# Patient Record
Sex: Male | Born: 1960 | Race: Black or African American | Hispanic: No | Marital: Married | Smoking: Never smoker
Health system: Southern US, Community
[De-identification: ages and names within clinical notes are randomized; demographics above are authoritative.]

## PROBLEM LIST (undated history)

## (undated) DIAGNOSIS — E119 Type 2 diabetes mellitus without complications: Secondary | ICD-10-CM

## (undated) DIAGNOSIS — I10 Essential (primary) hypertension: Secondary | ICD-10-CM

## (undated) DIAGNOSIS — G4730 Sleep apnea, unspecified: Secondary | ICD-10-CM

## (undated) DIAGNOSIS — M109 Gout, unspecified: Secondary | ICD-10-CM

## (undated) HISTORY — DX: Sleep apnea, unspecified: G47.30

## (undated) HISTORY — DX: Gout, unspecified: M10.9

## (undated) HISTORY — PX: APPENDECTOMY: SHX54

## (undated) HISTORY — DX: Type 2 diabetes mellitus without complications: E11.9

---

## 2005-05-21 ENCOUNTER — Emergency Department: Payer: Self-pay | Admitting: Emergency Medicine

## 2008-09-29 ENCOUNTER — Emergency Department: Payer: Self-pay | Admitting: Emergency Medicine

## 2009-10-21 ENCOUNTER — Emergency Department: Payer: Self-pay | Admitting: Unknown Physician Specialty

## 2009-11-17 ENCOUNTER — Emergency Department: Payer: Self-pay | Admitting: Emergency Medicine

## 2011-06-30 ENCOUNTER — Emergency Department: Payer: Self-pay | Admitting: Emergency Medicine

## 2011-10-07 ENCOUNTER — Emergency Department: Payer: Self-pay | Admitting: Emergency Medicine

## 2011-10-07 LAB — CBC
HCT: 50.6 % (ref 40.0–52.0)
HGB: 18.2 g/dL — ABNORMAL HIGH (ref 13.0–18.0)
MCH: 30.7 pg (ref 26.0–34.0)
MCV: 85 fL (ref 80–100)
RBC: 5.94 10*6/uL — ABNORMAL HIGH (ref 4.40–5.90)

## 2011-10-07 LAB — BASIC METABOLIC PANEL
Anion Gap: 10 (ref 7–16)
BUN: 14 mg/dL (ref 7–18)
Chloride: 106 mmol/L (ref 98–107)
Creatinine: 1.41 mg/dL — ABNORMAL HIGH (ref 0.60–1.30)
EGFR (African American): >60
Glucose: 140 mg/dL — ABNORMAL HIGH (ref 65–99)
Osmolality: 280 (ref 275–301)
Sodium: 139 mmol/L (ref 136–145)

## 2011-10-07 LAB — TROPONIN I: Troponin-I: <0.02 ng/mL

## 2011-10-07 LAB — CK TOTAL AND CKMB (NOT AT ARMC)
CK, Total: 567 U/L — ABNORMAL HIGH (ref 35–232)
CK-MB: 2.1 ng/mL (ref 0.5–3.6)

## 2011-10-08 LAB — TROPONIN I: Troponin-I: <0.02 ng/mL

## 2012-09-05 ENCOUNTER — Emergency Department: Payer: Self-pay | Admitting: Emergency Medicine

## 2012-09-05 LAB — URINALYSIS, COMPLETE
Bilirubin,UR: NEGATIVE
Nitrite: NEGATIVE
Protein: NEGATIVE
RBC,UR: NONE SEEN /HPF (ref 0–5)
Specific Gravity: 1.01 (ref 1.003–1.030)
Squamous Epithelial: NONE SEEN
WBC UR: 1 /HPF (ref 0–5)

## 2012-09-05 LAB — COMPREHENSIVE METABOLIC PANEL
Albumin: 4.1 g/dL (ref 3.4–5.0)
Alkaline Phosphatase: 86 U/L (ref 50–136)
Anion Gap: 4 — ABNORMAL LOW (ref 7–16)
Calcium, Total: 9.1 mg/dL (ref 8.5–10.1)
EGFR (Non-African Amer.): 47 — ABNORMAL LOW
Glucose: 89 mg/dL (ref 65–99)
Osmolality: 277 (ref 275–301)
SGPT (ALT): 45 U/L (ref 12–78)
Sodium: 137 mmol/L (ref 136–145)
Total Protein: 7.8 g/dL (ref 6.4–8.2)

## 2012-09-05 LAB — CBC
HGB: 16.7 g/dL (ref 13.0–18.0)
MCH: 29 pg (ref 26.0–34.0)
MCHC: 34.7 g/dL (ref 32.0–36.0)
MCV: 84 fL (ref 80–100)
RBC: 5.74 10*6/uL (ref 4.40–5.90)
RDW: 14.5 % (ref 11.5–14.5)

## 2015-06-16 ENCOUNTER — Encounter: Payer: Self-pay | Admitting: Emergency Medicine

## 2015-06-16 ENCOUNTER — Emergency Department
Admission: EM | Admit: 2015-06-16 | Discharge: 2015-06-16 | Disposition: A | Discharge status: Home / Self Care | Payer: BLUE CROSS/BLUE SHIELD | Attending: Emergency Medicine | Admitting: Emergency Medicine

## 2015-06-16 ENCOUNTER — Emergency Department: Payer: BLUE CROSS/BLUE SHIELD

## 2015-06-16 DIAGNOSIS — I159 Secondary hypertension, unspecified: Secondary | ICD-10-CM

## 2015-06-16 DIAGNOSIS — H9202 Otalgia, left ear: Secondary | ICD-10-CM | POA: Insufficient documentation

## 2015-06-16 DIAGNOSIS — R609 Edema, unspecified: Secondary | ICD-10-CM | POA: Diagnosis present

## 2015-06-16 HISTORY — DX: Essential (primary) hypertension: I10

## 2015-06-16 LAB — CBC WITH DIFFERENTIAL/PLATELET
BASOS PCT: 0 %
Basophils Absolute: 0 10*3/uL (ref 0–0.1)
EOS ABS: 0 10*3/uL (ref 0–0.7)
Eosinophils Relative: 1 %
HCT: 47.8 % (ref 40.0–52.0)
HEMOGLOBIN: 16.3 g/dL (ref 13.0–18.0)
LYMPHS ABS: 2.1 10*3/uL (ref 1.0–3.6)
LYMPHS PCT: 23 %
MCH: 28.4 pg (ref 26.0–34.0)
MCHC: 34.1 g/dL (ref 32.0–36.0)
MCV: 83.1 fL (ref 80.0–100.0)
MONO ABS: 0.5 10*3/uL (ref 0.2–1.0)
Monocytes Relative: 6 %
NEUTROS ABS: 6.2 10*3/uL (ref 1.4–6.5)
NEUTROS PCT: 70 %
Platelets: 215 10*3/uL (ref 150–440)
RBC: 5.75 MIL/uL (ref 4.40–5.90)
RDW: 14.5 % (ref 11.5–14.5)
WBC: 8.9 10*3/uL (ref 3.8–10.6)

## 2015-06-16 LAB — BASIC METABOLIC PANEL
Anion gap: 10 (ref 5–15)
BUN: 19 mg/dL (ref 6–20)
CALCIUM: 8.9 mg/dL (ref 8.9–10.3)
CO2: 23 mmol/L (ref 22–32)
CREATININE: 1.29 mg/dL — AB (ref 0.61–1.24)
Chloride: 104 mmol/L (ref 101–111)
GFR calc Af Amer: >60 mL/min (ref >60)
GLUCOSE: 135 mg/dL — AB (ref 65–99)
Potassium: 3.6 mmol/L (ref 3.5–5.1)
Sodium: 137 mmol/L (ref 135–145)

## 2015-06-16 MED ORDER — HYDROCHLOROTHIAZIDE 25 MG PO TABS: 25.0000 mg | ORAL_TABLET | Freq: Every day | ORAL | Status: DC

## 2015-06-16 MED ORDER — CLONIDINE HCL 0.2 MG PO TABS
0.2000 mg | ORAL_TABLET | Freq: Two times a day (BID) | ORAL | Status: DC | PRN
Start: 2015-06-16 — End: 2016-01-16

## 2015-06-16 MED ORDER — CLONIDINE HCL 0.1 MG PO TABS
0.2000 mg | ORAL_TABLET | Freq: Once | ORAL | Status: AC
  Administered 2015-06-16: 0.2 mg via ORAL
  Filled 2015-06-16: qty 2

## 2015-06-16 MED ORDER — HYDROCHLOROTHIAZIDE 25 MG PO TABS
25.0000 mg | ORAL_TABLET | Freq: Every day | ORAL | Status: DC
  Administered 2015-06-16: 25 mg via ORAL
  Filled 2015-06-16: qty 1

## 2015-06-16 MED ORDER — CLONIDINE HCL 0.1 MG PO TABS: 0.2000 mg | ORAL_TABLET | Freq: Two times a day (BID) | ORAL | Status: DC | PRN

## 2015-06-16 NOTE — ED Notes (Addendum)
States that job requires a lot of standing and patient has noticed ankle swelling lately that resolves overnight.  Recently (last 4 weeks) has had pain to bottom of left foot near base of heel.  Pain worse when initially walking and improves during the day, but symptoms have worsened.  Patient initially went to [facility] to be seen, but was referred to ED due to elevated BP

## 2015-06-16 NOTE — Discharge Instructions (Signed)

## 2015-06-16 NOTE — ED Notes (Signed)
3 people have attempted blood draw. Unsuccessful. Lab at bedside to attempt.

## 2015-06-17 NOTE — ED Provider Notes (Signed)
Time Seen: Approximately 1700  I have reviewed the triage notes  Chief Complaint: Foot Pain   History of Present Illness: Francisco Rice is a 55 y.o. male who presents after being referred here from [facility]. Patient presented to with complaints of some bilateral peripheral edema and left ear pain. He was found to be hypertensive there. He states he has a history of hypertension but is currently not on any medications. Patient states his swelling is mostly noticed at the end of the day after he is been on his feet through most of the work day. He states he edema will resolve when he props his feet up. He denies any fever, calf tenderness, significant pulmonary or DVT risk factors. He denies any trauma but states he's had some noticeable pain with ambulation at the base of his left heel.   Past Medical History  Diagnosis Date  . Hypertension     There are no active problems to display for this patient.   Past Surgical History  Procedure Laterality Date  . Appendectomy      Past Surgical History  Procedure Laterality Date  . Appendectomy      Current Outpatient Rx  Name  Route  Sig  Dispense  Refill  . cloNIDine (CATAPRES) 0.2 MG tablet   Oral   Take 1 tablet (0.2 mg total) by mouth 2 (two) times daily as needed (hypertension).   60 tablet   0   . hydrochlorothiazide (HYDRODIURIL) 25 MG tablet   Oral   Take 1 tablet (25 mg total) by mouth daily.   30 tablet   0     Allergies:  Review of patient's allergies indicates no known allergies.  Family History: No family history on file.  Social History: Social History  Substance Use Topics  . Smoking status: Never Smoker   . Smokeless tobacco: None  . Alcohol Use: No     Review of Systems:   10 point review of systems was performed and was otherwise negative:  Constitutional: No fever Eyes: No visual disturbances ENT: No sore throat, ear pain Cardiac: No chest pain Respiratory: No shortness of  breath, wheezing, or stridor Abdomen: No abdominal pain, no vomiting, No diarrhea Endocrine: No weight loss, No night sweats Extremities: Mild peripheral edema peripheral edema, especially after standing Skin: No rashes, easy bruising Neurologic: No focal weakness, trouble with speech or swollowing Urologic: No dysuria, Hematuria, or urinary frequency   Physical Exam:  ED Triage Vitals  Enc Vitals Group     BP 06/16/15 1525 173/108 mmHg     Pulse Rate 06/16/15 1525 104     Resp 06/16/15 1525 16     Temp 06/16/15 1525 98.9 F (37.2 C)     Temp Source 06/16/15 1525 Oral     SpO2 06/16/15 1525 96 %     Weight 06/16/15 1525 320 lb (145.151 kg)     Height 06/16/15 1525 6\' 1"  (1.854 m)     Head Cir --      Peak Flow --      Pain Score 06/16/15 1526 3     Pain Loc --      Pain Edu? --      Excl. in GC? --     General: Awake , Alert , and Oriented times 3; GCS 15 Head: Normal cephalic , atraumatic Eyes: Pupils equal , round, reactive to light Nose/Throat: No nasal drainage, patent upper airway without erythema or exudate.  Neck: Supple, Full range  of motion, No anterior adenopathy or palpable thyroid masses Lungs: Clear to ascultation without wheezes , rhonchi, or rales Heart: Regular rate, regular rhythm without murmurs , gallops , or rubs Abdomen: Soft, non tender without rebound, guarding , or rigidity; bowel sounds positive and symmetric in all 4 quadrants. No organomegaly .        Extremities:Mild circumferential edema with tenderness at the base of the left heel with an intact Achilles tendon. No calf tenderness. Negative Homans sign edema is symmetric in both lower extremities Neurologic: normal ambulation, Motor symmetric without deficits, sensory intact Skin: warm, dry, no rashes   Labs:   All laboratory work was reviewed including any pertinent negatives or positives listed below:  Labs Reviewed  BASIC METABOLIC PANEL - Abnormal; Notable for the following:     Glucose, Bld 135 (*)    Creatinine, Ser 1.29 (*)    All other components within normal limits  CBC WITH DIFFERENTIAL/PLATELET  Patient has some mild renal insufficiency which actually seems improved from prior studies  Radiology:     DG Foot 2 Views Left (Final result) Result time: 06/16/15 17:41:44   Final result by Rad Results In Interface (06/16/15 17:41:44)   Narrative:   CLINICAL DATA: Plantar foot pain near the heel for 2 months.  EXAM: LEFT FOOT - 2 VIEW  COMPARISON: None.  FINDINGS: Negative for fracture or dislocation. Prominent spurs involving the calcaneus along the plantar aspect and at the insertion of the Achilles tendon. Alignment of the foot is within normal limits. Soft tissues are unremarkable.  IMPRESSION: Large calcaneal spurs.  No acute bone abnormality.   Electronically Signed By: Richarda Overlie M.D. On: 06/16/2015 17:41       I personally reviewed the radiologic studies    ED Course:  Patient arrives with history of hypertension though currently not on any medications. Patient has some mild peripheral edema but otherwise no clinical presentation for hypertensive emergency. Patient was started on clonidine and serial blood pressures showed some small improvement he'll be discharged on hydrochlorothiazide as that I felt this would help him with the peripheral edema. She was advised his workup is not complete and he'll need to follow-up with the clinic. The patient appears to be of understanding and the patient was discharged.    Assessment: Hypertensive urgency Bone spur left calcaneus  Final Clinical Impression:  Final diagnoses:  Secondary hypertension, unspecified     Plan:  Outpatient management Patient was advised to return immediately if condition worsens. Patient was advised to follow up with their primary care physician or other specialized physicians involved in their outpatient care. The patient and/or family  member/power of attorney had laboratory results reviewed at the bedside. All questions and concerns were addressed and appropriate discharge instructions were distributed by the nursing staff.             Jennye Moccasin, MD 06/17/15 [phone]

## 2015-06-23 ENCOUNTER — Emergency Department
Admission: EM | Admit: 2015-06-23 | Discharge: 2015-06-23 | Disposition: A | Discharge status: Home / Self Care | Payer: BLUE CROSS/BLUE SHIELD | Attending: Emergency Medicine | Admitting: Emergency Medicine

## 2015-06-23 ENCOUNTER — Emergency Department: Payer: BLUE CROSS/BLUE SHIELD

## 2015-06-23 DIAGNOSIS — M7730 Calcaneal spur, unspecified foot: Secondary | ICD-10-CM | POA: Diagnosis not present

## 2015-06-23 DIAGNOSIS — M25571 Pain in right ankle and joints of right foot: Secondary | ICD-10-CM | POA: Diagnosis present

## 2015-06-23 DIAGNOSIS — M722 Plantar fascial fibromatosis: Secondary | ICD-10-CM | POA: Diagnosis not present

## 2015-06-23 DIAGNOSIS — I10 Essential (primary) hypertension: Secondary | ICD-10-CM | POA: Insufficient documentation

## 2015-06-23 NOTE — Discharge Instructions (Signed)
Advised to purchase over-the-counter heel supports pending evaluation by podiatry.

## 2015-06-23 NOTE — ED Notes (Addendum)
See triage note  Pain and swelling noted to right ankle denies any injury

## 2015-06-23 NOTE — ED Provider Notes (Signed)
[facility] Emergency Department Provider Note  ____________________________________________  Time seen: Approximately 3:25 PM  I have reviewed the triage vital signs and the nursing notes.   HISTORY  Chief Complaint Ankle Pain    HPI Francisco Rice is a 55 y.o. male patient complaining of right ankle and heel pain since a.m. awakening. Patient said he worked last night and he stated is consistent prolonged standing. Patient had no discomfort at work but awakened with edema and pain to the right ankle and heel. No palliative measures for this complaint. Patient was seen a week ago for same complaint along with elevated blood pressure. When patient was seen last week in ED his blood pressure medicines was restarted. Patient has not follow-up with primary provider at his family clinic. Patient went to his family clinic today and because of his elevated blood pressure was resent to ER even if he started to medications. Patient wished to have his pain and swelling in his right ankle evaluated. Patient rates his pain as a 2/10. No palliative measures for this complaint. Past Medical History  Diagnosis Date  . Hypertension     There are no active problems to display for this patient.   Past Surgical History  Procedure Laterality Date  . Appendectomy      Current Outpatient Rx  Name  Route  Sig  Dispense  Refill  . cloNIDine (CATAPRES) 0.2 MG tablet   Oral   Take 1 tablet (0.2 mg total) by mouth 2 (two) times daily as needed (hypertension).   60 tablet   0   . hydrochlorothiazide (HYDRODIURIL) 25 MG tablet   Oral   Take 1 tablet (25 mg total) by mouth daily.   30 tablet   0     Allergies Review of patient's allergies indicates no known allergies.  No family history on file.  Social History Social History  Substance Use Topics  . Smoking status: Never Smoker   . Smokeless tobacco: None  . Alcohol Use: No    Review of  Systems Constitutional: No fever/chills Eyes: No visual changes. ENT: No sore throat. Cardiovascular: Denies chest pain. Respiratory: Denies shortness of breath. Gastrointestinal: No abdominal pain.  No nausea, no vomiting.  No diarrhea.  No constipation. Genitourinary: Negative for dysuria. Musculoskeletal: Right ankle pain Skin: Negative for rash. Neurological: Negative for headaches, focal weakness or numbness. Endocrine:Hypertension  ____________________________________________   PHYSICAL EXAM:  VITAL SIGNS: ED Triage Vitals  Enc Vitals Group     BP 06/23/15 1501 167/92 mmHg     Pulse Rate 06/23/15 1501 91     Resp 06/23/15 1501 18     Temp 06/23/15 1501 98.5 F (36.9 C)     Temp Source 06/23/15 1501 Oral     SpO2 06/23/15 1501 97 %     Weight 06/23/15 1501 320 lb (145.151 kg)     Height 06/23/15 1501  (1.854 m)     Head Cir --      Peak Flow --      Pain Score 06/23/15 1502 2     Pain Loc --      Pain Edu? --      Excl. in GC? --     Constitutional: Alert and oriented. Well appearing and in no acute distress.Obesity Eyes: Conjunctivae are normal. PERRL. EOMI. Head: Atraumatic. Nose: No congestion/rhinnorhea. Mouth/Throat: Mucous membranes are moist.  Oropharynx non-erythematous. Neck: No stridor.  No cervical spine tenderness to palpation. Hematological/Lymphatic/Immunilogical: No cervical lymphadenopathy. Cardiovascular:  Normal rate, regular rhythm. Grossly normal heart sounds.  Good peripheral circulation. Hypertension Respiratory: Normal respiratory effort.  No retractions. Lungs CTAB. Gastrointestinal: Soft and nontender. No distention. No abdominal bruits. No CVA tenderness. Musculoskeletal: No obvious deformity or edema to the bilateral lower extremities at this time. Patient has some moderate guarding at the insertion point of the Achilles tendon and the plan aspect of his heel.  Neurologic:  Normal speech and language. No gross focal neurologic  deficits are appreciated. No gait instability. Skin:  Skin is warm, dry and intact. No rash noted. Psychiatric: Mood and affect are normal. Speech and behavior are normal.  ____________________________________________   LABS (all labs ordered are listed, but only abnormal results are displayed)  Labs Reviewed - No data to display ____________________________________________  EKG   ____________________________________________  RADIOLOGY  No acute findings. Soft tissue edema is apparent. Patient has a Achilles and calcaneus spur. ____________________________________________   PROCEDURES  Procedure(s) performed: None  Critical Care performed: No  ____________________________________________   INITIAL IMPRESSION / ASSESSMENT AND PLAN / ED COURSE  Pertinent labs & imaging results that were available during my care of the patient were reviewed by me and considered in my medical decision making (see chart for details).  Plantar fasciitis secondary to heel spurs. Patient given discharge care instructions. Patient advised to purchase over-the-counter heel spur sports pending further evaluation by podiatrist. Advised to follow-up family doctor for his hypertension. ____________________________________________   FINAL CLINICAL IMPRESSION(S) / ED DIAGNOSES  Final diagnoses:  Plantar fasciitis, bilateral  Heel spur, unspecified laterality      NEW MEDICATIONS STARTED DURING THIS VISIT:  New Prescriptions   No medications on file     Note:  This document was prepared using Dragon voice recognition software and may include unintentional dictation errors.    Joni Reiningonald K Smith, PA-C 06/23/15 1602  Myrna Blazeravid Matthew Schaevitz, MD 06/23/15 [phone]

## 2015-06-23 NOTE — ED Notes (Signed)
Pt sent [facility] , pt states he came home from work last night and was fine, states when he got up this morning and has pain and swelling in the right ankle..Marland Kitchen

## 2015-07-21 ENCOUNTER — Ambulatory Visit: Payer: Self-pay | Admitting: Podiatry

## 2016-01-06 ENCOUNTER — Emergency Department: Payer: Commercial Managed Care - PPO

## 2016-01-06 ENCOUNTER — Emergency Department
Admission: EM | Admit: 2016-01-06 | Discharge: 2016-01-06 | Disposition: A | Discharge status: Home / Self Care | Payer: Commercial Managed Care - PPO | Attending: Student in an Organized Health Care Education/Training Program | Admitting: Student in an Organized Health Care Education/Training Program

## 2016-01-06 DIAGNOSIS — I10 Essential (primary) hypertension: Secondary | ICD-10-CM | POA: Diagnosis not present

## 2016-01-06 DIAGNOSIS — Z79899 Other long term (current) drug therapy: Secondary | ICD-10-CM | POA: Diagnosis not present

## 2016-01-06 DIAGNOSIS — R51 Headache: Secondary | ICD-10-CM

## 2016-01-06 DIAGNOSIS — R519 Headache, unspecified: Secondary | ICD-10-CM

## 2016-01-06 LAB — BASIC METABOLIC PANEL
Anion gap: 9 (ref 5–15)
BUN: 15 mg/dL (ref 6–20)
CALCIUM: 9.6 mg/dL (ref 8.9–10.3)
CHLORIDE: 101 mmol/L (ref 101–111)
CO2: 25 mmol/L (ref 22–32)
Creatinine, Ser: 1.27 mg/dL — ABNORMAL HIGH (ref 0.61–1.24)
GFR calc Af Amer: >60 mL/min (ref >60)
GFR calc non Af Amer: >60 mL/min (ref >60)
GLUCOSE: 274 mg/dL — AB (ref 65–99)
Potassium: 3.9 mmol/L (ref 3.5–5.1)
Sodium: 135 mmol/L (ref 135–145)

## 2016-01-06 LAB — CBC
HCT: 53 % — ABNORMAL HIGH (ref 40.0–52.0)
HEMOGLOBIN: 18.1 g/dL — AB (ref 13.0–18.0)
MCH: 28.1 pg (ref 26.0–34.0)
MCHC: 34.1 g/dL (ref 32.0–36.0)
MCV: 82.4 fL (ref 80.0–100.0)
Platelets: 250 10*3/uL (ref 150–440)
RBC: 6.43 MIL/uL — ABNORMAL HIGH (ref 4.40–5.90)
RDW: 14.2 % (ref 11.5–14.5)
WBC: 11.2 10*3/uL — ABNORMAL HIGH (ref 3.8–10.6)

## 2016-01-06 LAB — TROPONIN I

## 2016-01-06 MED ORDER — AMLODIPINE BESYLATE 5 MG PO TABS: 5.0000 mg | ORAL_TABLET | Freq: Every day | ORAL | 0 refills | Status: DC

## 2016-01-06 MED ORDER — CLONIDINE HCL 0.1 MG PO TABS
0.1000 mg | ORAL_TABLET | Freq: Once | ORAL | Status: AC
  Administered 2016-01-06: 0.1 mg via ORAL
  Filled 2016-01-06 (×2): qty 1

## 2016-01-06 MED ORDER — AMLODIPINE BESYLATE 5 MG PO TABS
ORAL_TABLET | ORAL | Status: AC
  Administered 2016-01-06: 5 mg via ORAL
  Filled 2016-01-06: qty 1

## 2016-01-06 MED ORDER — AMLODIPINE BESYLATE 5 MG PO TABS
5.0000 mg | ORAL_TABLET | Freq: Once | ORAL | Status: AC
  Administered 2016-01-06: 5 mg via ORAL

## 2016-01-06 MED ORDER — T.E.D. BELOW KNEE/L-REGULAR MISC: 1.0000 "application " | Freq: Every day | 0 refills | Status: DC

## 2016-01-06 MED ORDER — ACETAMINOPHEN 500 MG PO TABS
1000.0000 mg | ORAL_TABLET | Freq: Once | ORAL | Status: AC
  Administered 2016-01-06: 1000 mg via ORAL

## 2016-01-06 MED ORDER — ACETAMINOPHEN 500 MG PO TABS
ORAL_TABLET | ORAL | Status: AC
  Administered 2016-01-06: 1000 mg via ORAL
  Filled 2016-01-06: qty 2

## 2016-01-06 MED ORDER — HYDROCHLOROTHIAZIDE 25 MG PO TABS: 25.0000 mg | ORAL_TABLET | Freq: Every day | ORAL | 0 refills | Status: DC

## 2016-01-06 NOTE — ED Notes (Signed)
Pt given ginger ale and graham crackers.

## 2016-01-06 NOTE — ED Notes (Signed)
Patient transported to CT 

## 2016-01-06 NOTE — ED Provider Notes (Signed)
[facility] Emergency Department Provider Note    First MD Initiated Contact with Patient 01/06/16 1941     (approximate)  I have reviewed the triage vital signs and the nursing notes.   HISTORY  Chief Complaint Hypertension    HPI Geordan Xu is a 56 y.o. male chief complaint of headache as well as high blood pressure. 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 The symptoms lasted about 5 minutes. There is no associated chest pain or shortness of breath. Patient went to check his blood pressure and it was elevated. States that he did not have anything to eat this morning. States that they're very busy at work today. 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 He doesn't want to fast negative be checked. They refused to see him due to his elevated blood pressure and sent him to the ER. He currently states his only complaint is a headache. Denies any numbness or tingling. Denies any shortness of breath or chest pain.   Past Medical History:  Diagnosis Date  . Hypertension    No family history on file. Past Surgical History:  Procedure Laterality Date  . APPENDECTOMY     There are no active problems to display for this patient.     Prior to Admission medications   Medication Sig Start Date End Date Taking? Authorizing Provider  amLODipine (NORVASC) 5 MG tablet Take 1 tablet (5 mg total) by mouth daily. 01/06/16 01/05/17  Willy Eddy, MD  cloNIDine (CATAPRES) 0.2 MG tablet Take 1 tablet (0.2 mg total) by mouth 2 (two) times daily as needed (hypertension). 06/16/15   Jennye Moccasin, MD  Elastic Bandages & Supports (T.E.D. BELOW KNEE/L-REGULAR) MISC 1 application by Does not apply route daily at 6 (six) AM. 01/06/16   Willy Eddy,  MD  hydrochlorothiazide (HYDRODIURIL) 25 MG tablet Take 1 tablet (25 mg total) by mouth daily. 01/06/16   Willy Eddy, MD    Allergies Patient has no known allergies.    Social History Social History  Substance Use Topics  . Smoking status: Never Smoker  . Smokeless tobacco: Not on file  . Alcohol use No    Review of Systems Patient denies headaches, rhinorrhea, blurry vision, numbness, shortness of breath, chest pain, edema, cough, abdominal pain, nausea, vomiting, diarrhea, dysuria, fevers, rashes or hallucinations unless otherwise stated above in HPI. ____________________________________________   PHYSICAL EXAM:  VITAL SIGNS: Vitals:   01/06/16 2044 01/06/16 2148  BP: (!) 194/119 (!) 178/110  Pulse: 77 78  Resp: 18 16  Temp:      Constitutional: Alert and oriented. Well appearing and in no acute distress. Eyes: Conjunctivae are normal. PERRL. EOMI. Head: Atraumatic. Nose: No congestion/rhinnorhea. Mouth/Throat: Mucous membranes are moist.  Oropharynx non-erythematous. Neck: No stridor. Painless ROM. No cervical spine tenderness to palpation Hematological/Lymphatic/Immunilogical: No cervical lymphadenopathy. Cardiovascular: Normal rate, regular rhythm. Grossly normal heart sounds.  Good peripheral circulation. Respiratory: Normal respiratory effort.  No retractions. Lungs CTAB. Gastrointestinal: Soft and nontender. No distention. No abdominal bruits. No CVA tenderness. Genitourinary:  Musculoskeletal: No lower extremity tenderness nor edema.  No joint effusions. Neurologic:  CN- intact.  No facial droop, Normal FNF.  Normal heel to shin.  Sensation intact bilaterally. Normal speech and language.  No gross focal neurologic deficits are appreciated. No gait instability.  Skin:  Skin is warm, dry and intact. No rash noted. Psychiatric: Mood and affect are normal. Speech and behavior are normal.  ____________________________________________   LABS (all labs  ordered are listed, but only abnormal results are displayed)  Results for orders placed or performed during the hospital encounter of 01/06/16 (from the past 24 hour(s))  Basic metabolic panel     Status: Abnormal   Collection Time: 01/06/16  4:45 PM  Result Value Ref Range   Sodium 135 135 - 145 mmol/L   Potassium 3.9 3.5 - 5.1 mmol/L   Chloride 101 101 - 111 mmol/L   CO2 25 22 - 32 mmol/L   Glucose, Bld 274 (H) 65 - 99 mg/dL   BUN 15 6 - 20 mg/dL   Creatinine, Ser 1.61 (H) 0.61 - 1.24 mg/dL   Calcium 9.6 8.9 - 09.6 mg/dL   GFR calc non Af Amer >60 >60 mL/min   GFR calc Af Amer >60 >60 mL/min   Anion gap 9 5 - 15  CBC     Status: Abnormal   Collection Time: 01/06/16  4:45 PM  Result Value Ref Range   WBC 11.2 (H) 3.8 - 10.6 K/uL   RBC 6.43 (H) 4.40 - 5.90 MIL/uL   Hemoglobin 18.1 (H) 13.0 - 18.0 g/dL   HCT 04.5 (H) 40.9 - 81.1 %   MCV 82.4 80.0 - 100.0 fL   MCH 28.1 26.0 - 34.0 pg   MCHC 34.1 32.0 - 36.0 g/dL   RDW 91.4 78.2 - 95.6 %   Platelets 250 150 - 440 K/uL  Troponin I     Status: None   Collection Time: 01/06/16  4:45 PM  Result Value Ref Range   Troponin I <0.03 <0.03 ng/mL   ____________________________________________  EKG My review and personal interpretation at Time: 16:48   Indication: chest pain  Rate: 80  Rhythm: sinus Axis: normal Other: poor r wave progression, no acute ischemia ____________________________________________  RADIOLOGY  I personally reviewed all radiographic images ordered to evaluate for the above acute complaints and reviewed radiology reports and findings.  These findings were personally discussed with the patient.  Please see medical record for radiology report. ____________________________________________   PROCEDURES  Procedure(s) performed:  Procedures    Critical Care performed: no ____________________________________________   INITIAL IMPRESSION / ASSESSMENT AND PLAN / ED COURSE  Pertinent labs & imaging results  that were available during my care of the patient were reviewed by me and considered in my medical decision making (see chart for details).  DDX: htn urgnecny, edema, htn emergncy, htn  Jeromey Kruer is a 56 y.o. who presents to the ED with complaint of elevated blood pressure and headache. Patient also with brief neurodeficits earlier today however the symptoms not consistent with a vascular territory given right sided eye blurriness and brief episode of right finger tingling. Will order CT head to evaluate for any evidence of CVA. EKG without any evidence of acute ischemia.  Troponin is negative. Patient does not demonstrate any evidence of congestive heart failure and has no hypoxia. Based on a brief episode of numbness and tingling and elevated blood pressure with headache will order CT imaging to evaluate for any evidence of edema or acute ischemia.  Clinical Course as of Jan 06 2311  Wed Jan 06, 2016  2037 Patient reassessed. States headache is improving after Tylenol. Discussed results of CT with patient.  [PR]  2139  Patient reassessed and states headache is completely resolved. Denies any chest pain or shortness of breath. Blood pressure is improving. Will discharge with antihypertensive medication and follow-up with PCP.  [PR]    Clinical Course User Index [PR] Willy EddyPatrick Terrance Lanahan, MD     ____________________________________________   FINAL CLINICAL IMPRESSION(S) / ED DIAGNOSES  Final diagnoses:  Hypertension, unspecified type  Acute nonintractable headache, unspecified headache type      NEW MEDICATIONS STARTED DURING THIS VISIT:  Discharge Medication List as of 01/06/2016  9:22 PM       Note:  This document was prepared using Dragon voice recognition software and may include unintentional dictation errors.    Willy EddyPatrick Zandria Woldt, MD 01/06/16 [phone]

## 2016-01-06 NOTE — ED Triage Notes (Addendum)
Pt to [facility] due to headache, sent to ER due to high blood pressure. Pt reports tingling in fingers PTA. Pt alert and oriented X4, active, cooperative, pt in NAD. RR even and unlabored, color WNL.    Pt hx of hypertension, was taking natural supplement for BP but has not been taking recently.  Denies CP.

## 2016-01-12 ENCOUNTER — Encounter: Payer: Self-pay | Admitting: *Deleted

## 2016-01-12 DIAGNOSIS — R51 Headache: Secondary | ICD-10-CM

## 2016-01-12 DIAGNOSIS — I10 Essential (primary) hypertension: Secondary | ICD-10-CM

## 2016-01-12 DIAGNOSIS — R200 Anesthesia of skin: Secondary | ICD-10-CM | POA: Diagnosis not present

## 2016-01-12 DIAGNOSIS — Z5321 Procedure and treatment not carried out due to patient leaving prior to being seen by health care provider: Secondary | ICD-10-CM

## 2016-01-12 DIAGNOSIS — Z79899 Other long term (current) drug therapy: Secondary | ICD-10-CM

## 2016-01-12 DIAGNOSIS — I674 Hypertensive encephalopathy: Secondary | ICD-10-CM | POA: Diagnosis not present

## 2016-01-12 NOTE — ED Triage Notes (Addendum)
Pt reports a headache for 1 day.  Pt took aleve and bp meds.  No n/v/   Pt was seen here last week with similar sx.   Pt alert and speech clear.

## 2016-01-13 ENCOUNTER — Emergency Department: Payer: Commercial Managed Care - PPO

## 2016-01-13 ENCOUNTER — Inpatient Hospital Stay
Admission: EM | Admit: 2016-01-13 | Discharge: 2016-01-16 | DRG: 078 | Disposition: A | Discharge status: Home / Self Care | Payer: Commercial Managed Care - PPO | Attending: Internal Medicine | Admitting: Internal Medicine

## 2016-01-13 ENCOUNTER — Encounter: Payer: Self-pay | Admitting: Emergency Medicine

## 2016-01-13 ENCOUNTER — Inpatient Hospital Stay: Payer: Commercial Managed Care - PPO

## 2016-01-13 ENCOUNTER — Emergency Department
Admission: EM | Admit: 2016-01-13 | Discharge: 2016-01-13 | Disposition: A | Discharge status: Home / Self Care | Payer: Commercial Managed Care - PPO | Source: Home / Self Care

## 2016-01-13 DIAGNOSIS — G459 Transient cerebral ischemic attack, unspecified: Secondary | ICD-10-CM | POA: Diagnosis present

## 2016-01-13 DIAGNOSIS — E871 Hypo-osmolality and hyponatremia: Secondary | ICD-10-CM | POA: Diagnosis present

## 2016-01-13 DIAGNOSIS — E876 Hypokalemia: Secondary | ICD-10-CM | POA: Diagnosis present

## 2016-01-13 DIAGNOSIS — Z79899 Other long term (current) drug therapy: Secondary | ICD-10-CM | POA: Diagnosis not present

## 2016-01-13 DIAGNOSIS — E860 Dehydration: Secondary | ICD-10-CM | POA: Diagnosis present

## 2016-01-13 DIAGNOSIS — Z6841 Body Mass Index (BMI) 40.0 and over, adult: Secondary | ICD-10-CM | POA: Diagnosis not present

## 2016-01-13 DIAGNOSIS — E1165 Type 2 diabetes mellitus with hyperglycemia: Secondary | ICD-10-CM | POA: Diagnosis present

## 2016-01-13 DIAGNOSIS — I674 Hypertensive encephalopathy: Principal | ICD-10-CM | POA: Diagnosis present

## 2016-01-13 DIAGNOSIS — N179 Acute kidney failure, unspecified: Secondary | ICD-10-CM | POA: Diagnosis present

## 2016-01-13 DIAGNOSIS — M109 Gout, unspecified: Secondary | ICD-10-CM | POA: Diagnosis present

## 2016-01-13 DIAGNOSIS — E118 Type 2 diabetes mellitus with unspecified complications: Secondary | ICD-10-CM

## 2016-01-13 DIAGNOSIS — I10 Essential (primary) hypertension: Secondary | ICD-10-CM | POA: Diagnosis present

## 2016-01-13 DIAGNOSIS — IMO0002 Reserved for concepts with insufficient information to code with codable children: Secondary | ICD-10-CM

## 2016-01-13 DIAGNOSIS — R200 Anesthesia of skin: Secondary | ICD-10-CM | POA: Diagnosis present

## 2016-01-13 DIAGNOSIS — R739 Hyperglycemia, unspecified: Secondary | ICD-10-CM

## 2016-01-13 LAB — CBC
HCT: 50.3 % (ref 40.0–52.0)
HEMATOCRIT: 53.1 % — AB (ref 40.0–52.0)
HEMOGLOBIN: 17.2 g/dL (ref 13.0–18.0)
Hemoglobin: 18.4 g/dL — ABNORMAL HIGH (ref 13.0–18.0)
MCH: 28.5 pg (ref 26.0–34.0)
MCH: 28.5 pg (ref 26.0–34.0)
MCHC: 34.1 g/dL (ref 32.0–36.0)
MCHC: 34.6 g/dL (ref 32.0–36.0)
MCV: 82.3 fL (ref 80.0–100.0)
MCV: 83.6 fL (ref 80.0–100.0)
PLATELETS: 247 10*3/uL (ref 150–440)
Platelets: 263 10*3/uL (ref 150–440)
RBC: 6.02 MIL/uL — AB (ref 4.40–5.90)
RBC: 6.46 MIL/uL — ABNORMAL HIGH (ref 4.40–5.90)
RDW: 13.9 % (ref 11.5–14.5)
RDW: 14.2 % (ref 11.5–14.5)
WBC: 10.2 10*3/uL (ref 3.8–10.6)
WBC: 12.5 10*3/uL — AB (ref 3.8–10.6)

## 2016-01-13 LAB — GLUCOSE, CAPILLARY
GLUCOSE-CAPILLARY: 402 mg/dL — AB (ref 65–99)
GLUCOSE-CAPILLARY: 350 mg/dL — AB (ref 65–99)
GLUCOSE-CAPILLARY: 345 mg/dL — AB (ref 65–99)
GLUCOSE-CAPILLARY: 349 mg/dL — AB (ref 65–99)
Glucose-Capillary: 539 mg/dL (ref 65–99)
Glucose-Capillary: 346 mg/dL — ABNORMAL HIGH (ref 65–99)

## 2016-01-13 LAB — COMPREHENSIVE METABOLIC PANEL
ALT: 39 U/L (ref 17–63)
ANION GAP: 12 (ref 5–15)
AST: 34 U/L (ref 15–41)
Albumin: 4.5 g/dL (ref 3.5–5.0)
Alkaline Phosphatase: 110 U/L (ref 38–126)
BUN: 18 mg/dL (ref 6–20)
CO2: 23 mmol/L (ref 22–32)
Calcium: 9 mg/dL (ref 8.9–10.3)
Chloride: 96 mmol/L — ABNORMAL LOW (ref 101–111)
Creatinine, Ser: 1.51 mg/dL — ABNORMAL HIGH (ref 0.61–1.24)
GFR calc non Af Amer: 51 mL/min — ABNORMAL LOW (ref >60)
GFR, EST AFRICAN AMERICAN: 59 mL/min — AB (ref >60)
Glucose, Bld: 749 mg/dL (ref 65–99)
POTASSIUM: 4.6 mmol/L (ref 3.5–5.1)
Sodium: 131 mmol/L — ABNORMAL LOW (ref 135–145)
TOTAL PROTEIN: 7.9 g/dL (ref 6.5–8.1)
Total Bilirubin: 1.5 mg/dL — ABNORMAL HIGH (ref 0.3–1.2)

## 2016-01-13 LAB — BLOOD GAS, VENOUS
Acid-base deficit: 5.9 mmol/L — ABNORMAL HIGH (ref 0.0–2.0)
Bicarbonate: 17.3 mmol/L — ABNORMAL LOW (ref 20.0–28.0)
FIO2: 0.21
O2 Saturation: 93 %
PCO2 VEN: 28 mmHg — AB (ref 44.0–60.0)
PH VEN: 7.4 (ref 7.250–7.430)
Patient temperature: 37
pO2, Ven: 67 mmHg — ABNORMAL HIGH (ref 32.0–45.0)

## 2016-01-13 LAB — URINALYSIS, COMPLETE (UACMP) WITH MICROSCOPIC
BACTERIA UA: NONE SEEN
BILIRUBIN URINE: NEGATIVE
Glucose, UA: >=500 mg/dL — AB
HGB URINE DIPSTICK: NEGATIVE
KETONES UR: NEGATIVE mg/dL
LEUKOCYTES UA: NEGATIVE
NITRITE: NEGATIVE
PROTEIN: NEGATIVE mg/dL
RBC / HPF: NONE SEEN RBC/hpf (ref 0–5)
Specific Gravity, Urine: 1.028 (ref 1.005–1.030)
Squamous Epithelial / LPF: NONE SEEN
pH: 7 (ref 5.0–8.0)

## 2016-01-13 LAB — BASIC METABOLIC PANEL
ANION GAP: 12 (ref 5–15)
BUN: 15 mg/dL (ref 6–20)
CO2: 22 mmol/L (ref 22–32)
Calcium: 9.3 mg/dL (ref 8.9–10.3)
Chloride: 96 mmol/L — ABNORMAL LOW (ref 101–111)
Creatinine, Ser: 1.39 mg/dL — ABNORMAL HIGH (ref 0.61–1.24)
GFR calc Af Amer: >60 mL/min (ref >60)
GFR, EST NON AFRICAN AMERICAN: 56 mL/min — AB (ref >60)
GLUCOSE: 522 mg/dL — AB (ref 65–99)
POTASSIUM: 4 mmol/L (ref 3.5–5.1)
Sodium: 130 mmol/L — ABNORMAL LOW (ref 135–145)

## 2016-01-13 LAB — DIFFERENTIAL
BASOS ABS: 0 10*3/uL (ref 0–0.1)
Basophils Relative: 0 %
EOS PCT: 0 %
Eosinophils Absolute: 0 10*3/uL (ref 0–0.7)
LYMPHS ABS: 2.3 10*3/uL (ref 1.0–3.6)
LYMPHS PCT: 19 %
Monocytes Absolute: 0.6 10*3/uL (ref 0.2–1.0)
Monocytes Relative: 5 %
NEUTROS ABS: 9.5 10*3/uL — AB (ref 1.4–6.5)
NEUTROS PCT: 76 %

## 2016-01-13 LAB — PROTIME-INR
INR: 0.88
PROTHROMBIN TIME: 11.9 s (ref 11.4–15.2)

## 2016-01-13 LAB — APTT: aPTT: <24 seconds — ABNORMAL LOW (ref 24–36)

## 2016-01-13 LAB — TROPONIN I: Troponin I: <0.03 ng/mL (ref <0.03)

## 2016-01-13 MED ORDER — INSULIN ASPART 100 UNIT/ML ~~LOC~~ SOLN
0.0000 [IU] | Freq: Every day | SUBCUTANEOUS | Status: DC
  Administered 2016-01-13 – 2016-01-14 (×2): 4 [IU] via SUBCUTANEOUS
  Administered 2016-01-15: 3 [IU] via SUBCUTANEOUS
  Filled 2016-01-13: qty 4
  Filled 2016-01-13: qty 3
  Filled 2016-01-13: qty 4

## 2016-01-13 MED ORDER — ASPIRIN EC 81 MG PO TBEC
81.0000 mg | DELAYED_RELEASE_TABLET | Freq: Every day | ORAL | Status: DC
  Administered 2016-01-14 – 2016-01-16 (×3): 81 mg via ORAL
  Filled 2016-01-13 (×3): qty 1

## 2016-01-13 MED ORDER — INSULIN ASPART 100 UNIT/ML ~~LOC~~ SOLN
4.0000 [IU] | Freq: Once | SUBCUTANEOUS | Status: AC
  Administered 2016-01-13: 4 [IU] via SUBCUTANEOUS

## 2016-01-13 MED ORDER — ONDANSETRON HCL 4 MG/2ML IJ SOLN
4.0000 mg | Freq: Four times a day (QID) | INTRAMUSCULAR | Status: DC | PRN
  Administered 2016-01-13: 4 mg via INTRAVENOUS
  Filled 2016-01-13: qty 2

## 2016-01-13 MED ORDER — LISINOPRIL 20 MG PO TABS
40.0000 mg | ORAL_TABLET | Freq: Every day | ORAL | Status: DC
  Administered 2016-01-13: 40 mg via ORAL
  Filled 2016-01-13: qty 2

## 2016-01-13 MED ORDER — LABETALOL HCL 100 MG PO TABS
100.0000 mg | ORAL_TABLET | Freq: Two times a day (BID) | ORAL | Status: DC
  Administered 2016-01-14 – 2016-01-16 (×5): 100 mg via ORAL
  Filled 2016-01-13 (×5): qty 1

## 2016-01-13 MED ORDER — SODIUM CHLORIDE 0.9% FLUSH
3.0000 mL | Freq: Two times a day (BID) | INTRAVENOUS | Status: DC
  Administered 2016-01-14: 3 mL via INTRAVENOUS

## 2016-01-13 MED ORDER — ACETAMINOPHEN 325 MG PO TABS
650.0000 mg | ORAL_TABLET | Freq: Four times a day (QID) | ORAL | Status: DC | PRN
  Administered 2016-01-15: 650 mg via ORAL
  Filled 2016-01-13: qty 2

## 2016-01-13 MED ORDER — OXYCODONE HCL 5 MG PO TABS: 5.0000 mg | ORAL_TABLET | ORAL | Status: DC | PRN

## 2016-01-13 MED ORDER — ACETAMINOPHEN 650 MG RE SUPP: 650.0000 mg | Freq: Four times a day (QID) | RECTAL | Status: DC | PRN

## 2016-01-13 MED ORDER — CLONIDINE HCL 0.1 MG PO TABS
0.2000 mg | ORAL_TABLET | ORAL | Status: AC
  Administered 2016-01-13: 0.2 mg via ORAL
  Filled 2016-01-13: qty 2

## 2016-01-13 MED ORDER — AMLODIPINE BESYLATE 10 MG PO TABS
10.0000 mg | ORAL_TABLET | Freq: Every day | ORAL | Status: DC
  Administered 2016-01-13 – 2016-01-14 (×2): 10 mg via ORAL
  Filled 2016-01-13: qty 1
  Filled 2016-01-13: qty 2

## 2016-01-13 MED ORDER — ASPIRIN 81 MG PO CHEW
81.0000 mg | CHEWABLE_TABLET | Freq: Once | ORAL | Status: AC
  Administered 2016-01-13: 81 mg via ORAL
  Filled 2016-01-13: qty 1

## 2016-01-13 MED ORDER — ENOXAPARIN SODIUM 40 MG/0.4ML ~~LOC~~ SOLN
40.0000 mg | SUBCUTANEOUS | Status: DC
  Administered 2016-01-14 – 2016-01-15 (×2): 40 mg via SUBCUTANEOUS
  Filled 2016-01-13 (×3): qty 0.4

## 2016-01-13 MED ORDER — LORAZEPAM 2 MG/ML IJ SOLN
INTRAMUSCULAR | Status: AC
  Filled 2016-01-13: qty 1

## 2016-01-13 MED ORDER — ENOXAPARIN SODIUM 40 MG/0.4ML ~~LOC~~ SOLN: 40.0000 mg | SUBCUTANEOUS | Status: DC

## 2016-01-13 MED ORDER — AMLODIPINE BESYLATE 5 MG PO TABS: 5.0000 mg | ORAL_TABLET | Freq: Every day | ORAL | Status: DC

## 2016-01-13 MED ORDER — INSULIN ASPART 100 UNIT/ML ~~LOC~~ SOLN
20.0000 [IU] | Freq: Once | SUBCUTANEOUS | Status: AC
  Administered 2016-01-13: 20 [IU] via SUBCUTANEOUS
  Filled 2016-01-13: qty 20

## 2016-01-13 MED ORDER — INSULIN GLARGINE 100 UNIT/ML ~~LOC~~ SOLN
20.0000 [IU] | Freq: Every day | SUBCUTANEOUS | Status: DC
  Administered 2016-01-13 – 2016-01-14 (×2): 20 [IU] via SUBCUTANEOUS
  Filled 2016-01-13 (×3): qty 0.2

## 2016-01-13 MED ORDER — SODIUM CHLORIDE 0.9 % IV BOLUS (SEPSIS)
1000.0000 mL | Freq: Once | INTRAVENOUS | Status: AC
  Administered 2016-01-13: 1000 mL via INTRAVENOUS

## 2016-01-13 MED ORDER — LABETALOL HCL 200 MG PO TABS: 200.0000 mg | ORAL_TABLET | Freq: Two times a day (BID) | ORAL | Status: DC

## 2016-01-13 MED ORDER — LABETALOL HCL 200 MG PO TABS
200.0000 mg | ORAL_TABLET | Freq: Once | ORAL | Status: AC
  Administered 2016-01-13: 200 mg via ORAL
  Filled 2016-01-13: qty 1

## 2016-01-13 MED ORDER — LORAZEPAM 2 MG/ML IJ SOLN
0.5000 mg | Freq: Once | INTRAMUSCULAR | Status: AC
  Administered 2016-01-13: 0.5 mg via INTRAVENOUS

## 2016-01-13 MED ORDER — CLONIDINE HCL 0.1 MG PO TABS
0.2000 mg | ORAL_TABLET | Freq: Two times a day (BID) | ORAL | Status: DC
  Administered 2016-01-13 – 2016-01-14 (×3): 0.2 mg via ORAL
  Filled 2016-01-13 (×3): qty 2

## 2016-01-13 MED ORDER — MAGNESIUM SULFATE 2 GM/50ML IV SOLN
2.0000 g | Freq: Once | INTRAVENOUS | Status: AC
  Administered 2016-01-13: 2 g via INTRAVENOUS
  Filled 2016-01-13: qty 50

## 2016-01-13 MED ORDER — INSULIN ASPART 100 UNIT/ML ~~LOC~~ SOLN
0.0000 [IU] | Freq: Three times a day (TID) | SUBCUTANEOUS | Status: DC
  Administered 2016-01-14 (×2): 9 [IU] via SUBCUTANEOUS
  Administered 2016-01-15 (×3): 5 [IU] via SUBCUTANEOUS
  Administered 2016-01-16: 3 [IU] via SUBCUTANEOUS
  Administered 2016-01-16: 5 [IU] via SUBCUTANEOUS
  Filled 2016-01-13 (×2): qty 5
  Filled 2016-01-13: qty 9
  Filled 2016-01-13: qty 5
  Filled 2016-01-13: qty 9
  Filled 2016-01-13: qty 4
  Filled 2016-01-13: qty 3
  Filled 2016-01-13 (×2): qty 5

## 2016-01-13 NOTE — H&P (Addendum)
[facility] [place] [facility]   PATIENT NAME: Francisco Rice    MR#:  [id number]  DATE OF BIRTH:  [birth year]  DATE OF ADMISSION:  01/13/2016  PRIMARY CARE PHYSICIAN: Luna FuseEJAN-SIE, SHEIKH AHMED, MD   REQUESTING/REFERRING PHYSICIAN: Dr Governor Rooksebecca Lord  CHIEF COMPLAINT:   Chief Complaint  Patient presents with  . Hypertension    HISTORY OF PRESENT ILLNESS:  Francisco Rice  is a 56 y.o. male with a known history of Hypertension. He presents back to the ER again today with headache and high blood pressure. On the snow day last week he came in and he had high blood pressure and numbness and he was started on Norvasc. He came in last night and was in the waiting room for hours and hours and then left. He went to work today and had numbness on his right hand. His wife has been previously giving him herbal blood pressure to. His headache has been going on for over 3 days. He's had some confusion for over 24 hours but did have some confusion last week also. Headache described as 3 out of 10 intensity. In the ER, his blood pressure has been extremely high and his sugar was over 700. Hospitalist services were contacted for further evaluation.   PAST MEDICAL HISTORY:   Past Medical History:  Diagnosis Date  . Hypertension     PAST SURGICAL HISTORY:   Past Surgical History:  Procedure Laterality Date  . APPENDECTOMY      SOCIAL HISTORY:   Social History  Substance Use Topics  . Smoking status: Never Smoker  . Smokeless tobacco: Never Used  . Alcohol use No    FAMILY HISTORY:   Family History  Problem Relation Age of Onset  . Healthy Mother   . Parkinson's disease Father     DRUG ALLERGIES:  No Known Allergies  REVIEW OF SYSTEMS:  CONSTITUTIONAL: No fever, fatigue or weakness. Positive for headache. EYES: No blurred or double vision.  EARS, NOSE, AND THROAT: No tinnitus or ear pain. No sore throat RESPIRATORY: No cough, shortness of breath,  wheezing or hemoptysis.  CARDIOVASCULAR: No chest pain, orthopnea, edema.  GASTROINTESTINAL: No nausea, vomiting, diarrhea or abdominal pain. No blood in bowel movements GENITOURINARY: No dysuria, hematuria.  ENDOCRINE: No polyuria, nocturia,  HEMATOLOGY: No anemia, easy bruising or bleeding SKIN: No rash or lesion. MUSCULOSKELETAL: No joint pain or arthritis.   NEUROLOGIC: Positive for confusion. Intermittent numbness of his arms. PSYCHIATRY: No anxiety or depression.   MEDICATIONS AT HOME:   Prior to Admission medications   Medication Sig Start Date End Date Taking? Authorizing Provider  amLODipine (NORVASC) 5 MG tablet Take 1 tablet (5 mg total) by mouth daily. 01/06/16 01/05/17 Yes Willy EddyPatrick Robinson, MD  cloNIDine (CATAPRES) 0.2 MG tablet Take 1 tablet (0.2 mg total) by mouth 2 (two) times daily as needed (hypertension). Patient not taking: Reported on 01/13/2016 06/16/15   Jennye MoccasinBrian S Quigley, MD  Elastic Bandages & Supports (T.E.D. BELOW KNEE/L-REGULAR) MISC 1 application by Does not apply route daily at 6 (six) AM. 01/06/16   Willy EddyPatrick Robinson, MD    Patient also showed me Diovan 320 mg daily and lisinopril 40 mg daily   VITAL SIGNS:  Blood pressure (!) 179/107, pulse 100, temperature 98.2 F (36.8 C), temperature source Oral, resp. rate 20, height 6\' 1"  (1.854 m), weight (!) 149.7 kg (330 lb), SpO2 96 %.  PHYSICAL EXAMINATION:  GENERAL:  56 y.o.-year-old patient lying in the bed with no acute  distress.  EYES: Pupils equal, round, reactive to light and accommodation. No scleral icterus. Extraocular muscles intact.  HEENT: Head atraumatic, normocephalic. Oropharynx and nasopharynx clear.  NECK:  Supple, no jugular venous distention. No thyroid enlargement, no tenderness.  LUNGS: Normal breath sounds bilaterally, no wheezing, rales,rhonchi or crepitation. No use of accessory muscles of respiration.  CARDIOVASCULAR: S1, S2 normal. No murmurs, rubs, or gallops.  ABDOMEN: Soft, nontender,  nondistended. Bowel sounds present. No organomegaly or mass.  EXTREMITIES: No pedal edema, cyanosis, or clubbing.  NEUROLOGIC: Cranial nerves II through XII are intact. Muscle strength 5/5 in all extremities. Sensation intact. Gait not checked. Patient with some confusion and had a be redirected on how to do things. PSYCHIATRIC: The patient is alert and answers yes or no questions appropriately but having trouble elaborating quickly.Marland Kitchen  SKIN: No rash, lesion, or ulcer.   LABORATORY PANEL:   CBC  Recent Labs Lab 01/13/16 1230  WBC 12.5*  HGB 17.2  HCT 50.3  PLT 247   ------------------------------------------------------------------------------------------------------------------  Chemistries   Recent Labs Lab 01/13/16 0917  NA 131*  K 4.6  CL 96*  CO2 23  GLUCOSE 749*  BUN 18  CREATININE 1.51*  CALCIUM 9.0  AST 34  ALT 39  ALKPHOS 110  BILITOT 1.5*   ------------------------------------------------------------------------------------------------------------------  Cardiac Enzymes  Recent Labs Lab 01/13/16 0917  TROPONINI <0.03   ------------------------------------------------------------------------------------------------------------------  RADIOLOGY:  Ct Head Wo Contrast  Result Date: 01/13/2016 CLINICAL DATA:  High blood pressure. Visual disturbance since yesterday. EXAM: CT HEAD WITHOUT CONTRAST TECHNIQUE: Contiguous axial images were obtained from the base of the skull through the vertex without intravenous contrast. COMPARISON:  01/06/2016 FINDINGS: Brain: Mild volume loss. No acute intracranial abnormality. Specifically, no hemorrhage, hydrocephalus, mass lesion, acute infarction, or significant intracranial injury. Vascular: No hyperdense vessel or unexpected calcification. Skull: No acute calvarial abnormality. Sinuses/Orbits: Visualized paranasal sinuses and mastoids clear. Orbital soft tissues unremarkable. Other: None IMPRESSION: No acute intracranial  abnormality. Electronically Signed   By: Charlett Nose M.D.   On: 01/13/2016 09:36    EKG:   Ordered by me  IMPRESSION AND PLAN:   1. Hypertensive encephalopathy. Patient ran out of his clonidine since this could be rebound hypertension. Start clonidine 0.2 mg stat and 0.2 mg twice a day. Increase Norvasc to 10 mg. Give lisinopril 40 mg daily at bedtime. I would rather bring the patient's blood pressure down slowly since this is been going on for a while and since there could be a question of a stroke or not. 2. Confusion and headache. This could be all hypertensive encephalopathy but need to rule out stroke. MRI of the brain. Continue aspirin on a daily basis. 3. Type 2 diabetes mellitus new diagnosis. Check a hemoglobin A1c. Start on sliding scale and Lantus 20 units daily. Give 20 units IV stat of short acting insulin. 4. Obesity weight loss needed 5. Dehydration and acute kidney injury and hyponatremia fluid bolus ordered. Hyponatremia secondary to high sugar also.  All the records are reviewed and case discussed with ED provider. Management plans discussed with the patient, family and they are in agreement.  CODE STATUS: Full code  TOTAL TIME TAKING CARE OF THIS PATIENT: 50 minutes.    Alford Highland M.D on 01/13/2016 at 2:04 PM  Between 7am to 6pm - Pager - [phone]  After 6pm call admission pager [phone]  [facility]  [phone]  CC: Primary care physician; Luna Fuse, MD

## 2016-01-13 NOTE — ED Notes (Signed)
Magnesium stopped in order for the patient to go to MRI. MRI techs are here to transport. Patient is calm, appears comfortable at this time.

## 2016-01-13 NOTE — ED Notes (Signed)
Patient awakens easily, but refuses to eat at this time. Patient did request a diet ginger ale. Dr. Hilton SinclairWeiting informed and alternative insulin order obtained.

## 2016-01-13 NOTE — ED Notes (Signed)
Danielle RN from 1C requested that patient's diastolic pressure is under 100 before he is transported.

## 2016-01-13 NOTE — ED Provider Notes (Addendum)
[facility] Emergency Department Provider Note ____________________________________________   I have reviewed the triage vital signs and the triage nursing note.  HISTORY  Chief Complaint Hypertension   Historian Patient and wife  HPI Francisco Rice is a 56 y.o. male who is coming in for evaluation of headaches over the past what sounds like probably 2-3 weeks. He's been diagnosed with high blood pressure for a while now, but had been taken off of hydrochlorothiazide due to leg swelling and states he had been just taking a natural supplement. 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 That had resolved during the hospital evaluation and he had a reassuring examination including negative CAT scan and was sent home with amlodipine for blood pressure.  Patient states he's had daily headaches since that time. He is continued to be overwhelmed and stressed at work.  Headache is severe at times, currently mild and rated 1 out of 10. He states that this morning he seems a little confused and slow to answer questions. This morning patient states he woke up and his right hand and arm felt numb and tingly. Denies weakness or coordination problems. Denies slurred speech or facial droop. Since she's been here to the emergency department he reports resolution of numbness and tingling in her right arm, and that it feels normal now.  Wife states he still not acting his normal mental status in terms of follow-up process is very slow.  Never been diagnosed with diabetes, but told at one point that his blood sugar was in the 200s, but A1c was 5. something.    Past Medical History:  Diagnosis Date  . Hypertension     There are no active problems to display for this patient.   Past Surgical History:  Procedure Laterality Date  . APPENDECTOMY       Prior to Admission medications   Medication Sig Start Date End Date Taking? Authorizing Provider  amLODipine (NORVASC) 5 MG tablet Take 1 tablet (5 mg total) by mouth daily. 01/06/16 01/05/17 Yes Willy EddyPatrick Robinson, MD  cloNIDine (CATAPRES) 0.2 MG tablet Take 1 tablet (0.2 mg total) by mouth 2 (two) times daily as needed (hypertension). Patient not taking: Reported on 01/13/2016 06/16/15   Jennye MoccasinBrian S Quigley, MD  Elastic Bandages & Supports (T.E.D. BELOW KNEE/L-REGULAR) MISC 1 application by Does not apply route daily at 6 (six) AM. 01/06/16   Willy EddyPatrick Robinson, MD  hydrochlorothiazide (HYDRODIURIL) 25 MG tablet Take 1 tablet (25 mg total) by mouth daily. Patient not taking: Reported on 01/13/2016 01/06/16   Willy EddyPatrick Robinson, MD    No Known Allergies  History reviewed. No pertinent family history.  Social History Social History  Substance Use Topics  . Smoking status: Never Smoker  . Smokeless tobacco: Never Used  . Alcohol use No    Review of Systems  Constitutional: Negative for fever. Eyes: Negative for visual changes. ENT: Negative for sore throat. Cardiovascular: Negative for chest pain. Respiratory: Negative for shortness of breath. Gastrointestinal: Negative for abdominal pain, vomiting and diarrhea. Genitourinary: Negative for dysuria.  Increased frequency of urination. Musculoskeletal: Negative for back pain. Skin: Negative for rash. Neurological: Positive for headache. 10 point Review of Systems otherwise negative ____________________________________________   PHYSICAL EXAM:  VITAL SIGNS: ED Triage Vitals  Enc Vitals Group     BP 01/13/16 0905 (!) 176/101     Pulse  Rate 01/13/16 0905 99     Resp 01/13/16 0905 18     Temp 01/13/16 0905 98.2 F (36.8 C)     Temp Source 01/13/16 0905 Oral     SpO2 01/13/16 0905 99 %     Weight 01/13/16 0905 (!) 330 lb (149.7 kg)     Height 01/13/16 0905 6\' 1"  (1.854 m)     Head Circumference --      Peak Flow --      Pain Score  01/13/16 0913 0     Pain Loc --      Pain Edu? --      Excl. in GC? --      Constitutional: Alert and Cooperative and oriented, but slow to answer, seemed a little bit confused initially, but really just very slow to answer. Well appearing and in no distress. HEENT   Head: Normocephalic and atraumatic.  Does have some flushed cheeks.      Eyes: Conjunctivae are normal. PERRL. Normal extraocular movements.      Ears:         Nose: No congestion/rhinnorhea.   Mouth/Throat: Mucous membranes are moist.   Neck: No stridor. Cardiovascular/Chest: Normal rate, regular rhythm.  No murmurs, rubs, or gallops. Respiratory: Normal respiratory effort without tachypnea nor retractions. Breath sounds are clear and equal bilaterally. No wheezes/rales/rhonchi. Gastrointestinal: Soft. No distention, no guarding, no rebound. Nontender.  Mildly obese.  Genitourinary/rectal:Deferred Musculoskeletal: Nontender with normal range of motion in all extremities. No joint effusions.  No lower extremity tenderness.  No edema. Neurologic: No facial droop. Cranial nerves II through X intact. Normal word choice and content, but somewhat slow to answer questions, appears "spacey." No gross or focal neurologic deficits are appreciated.  5 out of 5 strength in 4 extremities. Finger-nose intact bilaterally. No pronator drift. Skin:  Skin is warm, dry and intact. No rash noted. Psychiatric: Calm and cooperative, no agitation. No hallucinations.   ____________________________________________  LABS (pertinent positives/negatives)  Labs Reviewed  COMPREHENSIVE METABOLIC PANEL - Abnormal; Notable for the following:       Result Value   Sodium 131 (*)    Chloride 96 (*)    Glucose, Bld 749 (*)    Creatinine, Ser 1.51 (*)    Total Bilirubin 1.5 (*)    GFR calc non Af Amer 51 (*)    GFR calc Af Amer 59 (*)    All other components within normal limits  CBC - Abnormal; Notable for the following:    WBC 12.5 (*)     RBC 6.02 (*)    All other components within normal limits  GLUCOSE, CAPILLARY - Abnormal; Notable for the following:    Glucose-Capillary >600 (*)    All other components within normal limits  BLOOD GAS, VENOUS - Abnormal; Notable for the following:    pCO2, Ven 28 (*)    pO2, Ven 67.0 (*)    Bicarbonate 17.3 (*)    Acid-base deficit 5.9 (*)    All other components within normal limits  DIFFERENTIAL - Abnormal; Notable for the following:    Neutro Abs 9.5 (*)    All other components within normal limits  TROPONIN I  URINALYSIS, COMPLETE (UACMP) WITH MICROSCOPIC  APTT  PROTIME-INR  CBG MONITORING, ED    ____________________________________________    EKG I, Governor Rooks, MD, the attending physician have personally viewed and interpreted all ECGs.  94 bpm. Normal sinus rhythm. Narrow QRS. Normal axis. Nonspecific T wave. Q waves anteriorly.  EKG #  2. Heart rate 100 bpm. Sinus tachycardia. Narrow QRS. Normal axis. Nonspecific ST and T-wave ____________________________________________  RADIOLOGY All Xrays were viewed by me. Imaging interpreted by Radiologist.  CT head without contrast: No acute intracranial abnormality. __________________________________________  PROCEDURES  Procedure(s) performed: None  Critical Care performed: CRITICAL CARE Performed by: Governor Rooks   Total critical care time: 30 minutes  Critical care time was exclusive of separately billable procedures and treating other patients.  Critical care was necessary to treat or prevent imminent or life-threatening deterioration.  Critical care was time spent personally by me on the following activities: development of treatment plan with patient and/or surrogate as well as nursing, discussions with consultants, evaluation of patient's response to treatment, examination of patient, obtaining history from patient or surrogate, ordering and performing treatments and interventions, ordering and review of  laboratory studies, ordering and review of radiographic studies, pulse oximetry and re-evaluation of patient's condition.   ____________________________________________   ED COURSE / ASSESSMENT AND PLAN  Pertinent labs & imaging results that were available during my care of the patient were reviewed by me and considered in my medical decision making (see chart for details).  Mr. Lukehart is brought in with his wife due to ongoing headaches, also with neurologic complaining of numbness and tingling of his right upper extremity that he woke up with and lasted for several hours until it resolved before he got to the ED, and a complaint of some mildly elevated blood sugars in the past now associated with increased frequency of urination, as well as some slowed mental processes.  CT head was placed upon arrival and I reviewed them before saw the patient, this was negative for acute finding.  On exam he has no focal neurologic deficit, and reports that the right arm tingling is now gone although this is the same neurologic deficit that he had when he was evaluated before in the ED about a week ago.  Fingerstick blood sugar was critical high.  He does not carry a diagnosis of diabetes.  Possible headache which is relatively mild, and encephalopathy maybe coming from the acute hyperglycemia.  No DKA, but new diagnosis of severely elevated blood sugar with altered mental status.  I think he also needs evaluation for possible TIA.  Certainly not a candidate for code stroke alert or TPA given resolution of symptoms, minor symptoms, and unclear onset. Given the possibility of TIA, however I did start him on aspirin 81 mg. Discussed with hospitalist for admission.   CONSULTATIONS:   Hospitalist for admission.   Patient / Family / Caregiver informed of clinical course, medical decision-making process, and agree with plan.   ___________________________________________   FINAL CLINICAL  IMPRESSION(S) / ED DIAGNOSES   Final diagnoses:  Hyperglycemia  Transient cerebral ischemia, unspecified type              Note: This dictation was prepared with Dragon dictation. Any transcriptional errors that result from this process are unintentional    Governor Rooks, MD 01/13/16 1322    Governor Rooks, MD 01/13/16 [phone]

## 2016-01-13 NOTE — ED Notes (Signed)
Two unsuccessful iv attempts

## 2016-01-13 NOTE — ED Notes (Signed)
Patient given ED sandwich tray and diet Ginger ale.

## 2016-01-13 NOTE — ED Notes (Signed)
Nursing Supervisor called and stated that the patient's sister knew the director on 2C and wanted the patient placed on 2C. The director of 2C called the nursing supervisor and requested the bed change. Nursing Supervisor called this writer and bed control to inform of the change.

## 2016-01-13 NOTE — ED Notes (Signed)
Patient in waiting room after CT.  VS reassessed.  Speech clear.  No new symptoms.  Advised patient to make this nurse aware if any change in symptoms.

## 2016-01-13 NOTE — ED Notes (Signed)
cbg read Hi

## 2016-01-13 NOTE — ED Notes (Signed)
Patient is drowsy, easily awakened, still confused, but will follow commands. Wife is at bedside.

## 2016-01-13 NOTE — ED Notes (Signed)
Patient able to recall his name easily, but still is unable to give his correct birth date. Patient is drowsy, but easily awakened and can maintain his alertness during the conversation.

## 2016-01-13 NOTE — ED Notes (Signed)
Patient had large emesis over the railing on the floor. MD aware.

## 2016-01-13 NOTE — ED Triage Notes (Signed)
Pt to ed with c/o HTN,  Pt states he was seen here last night for same, but states he left before being seen.  Pt denies headache at this time.  Pt denies blurred vision.  Pt does report right hand is numb, reports it started when he awoke this am.  Reports he has an appt with MD on Friday.

## 2016-01-13 NOTE — ED Notes (Signed)
Dr. Hilton SinclairWeiting at bedside per request of patient's wife and sister. Catapres held at this time until patient is more alert.

## 2016-01-14 ENCOUNTER — Inpatient Hospital Stay: Payer: Commercial Managed Care - PPO

## 2016-01-14 LAB — CBC
HCT: 44.5 % (ref 40.0–52.0)
Hemoglobin: 15.2 g/dL (ref 13.0–18.0)
MCH: 28.3 pg (ref 26.0–34.0)
MCHC: 34.2 g/dL (ref 32.0–36.0)
MCV: 82.6 fL (ref 80.0–100.0)
PLATELETS: 262 10*3/uL (ref 150–440)
RBC: 5.38 MIL/uL (ref 4.40–5.90)
RDW: 13.9 % (ref 11.5–14.5)
WBC: 15.4 10*3/uL — ABNORMAL HIGH (ref 3.8–10.6)

## 2016-01-14 LAB — BASIC METABOLIC PANEL
Anion gap: 7 (ref 5–15)
BUN: 22 mg/dL — AB (ref 6–20)
CALCIUM: 8.7 mg/dL — AB (ref 8.9–10.3)
CO2: 24 mmol/L (ref 22–32)
CREATININE: 2.15 mg/dL — AB (ref 0.61–1.24)
Chloride: 105 mmol/L (ref 101–111)
GFR calc Af Amer: 39 mL/min — ABNORMAL LOW (ref >60)
GFR, EST NON AFRICAN AMERICAN: 33 mL/min — AB (ref >60)
Glucose, Bld: 310 mg/dL — ABNORMAL HIGH (ref 65–99)
Potassium: 3.7 mmol/L (ref 3.5–5.1)
Sodium: 136 mmol/L (ref 135–145)

## 2016-01-14 LAB — LIPID PANEL
CHOLESTEROL: 200 mg/dL (ref 0–200)
HDL: 39 mg/dL — ABNORMAL LOW (ref >40)
LDL CALC: 133 mg/dL — AB (ref 0–99)
TRIGLYCERIDES: 138 mg/dL (ref <150)
Total CHOL/HDL Ratio: 5.1 RATIO
VLDL: 28 mg/dL (ref 0–40)

## 2016-01-14 LAB — GLUCOSE, CAPILLARY
Glucose-Capillary: 281 mg/dL — ABNORMAL HIGH (ref 65–99)
Glucose-Capillary: 366 mg/dL — ABNORMAL HIGH (ref 65–99)
Glucose-Capillary: 356 mg/dL — ABNORMAL HIGH (ref 65–99)
Glucose-Capillary: 301 mg/dL — ABNORMAL HIGH (ref 65–99)

## 2016-01-14 LAB — HEMOGLOBIN A1C
HEMOGLOBIN A1C: 10 % — AB (ref 4.8–5.6)
Mean Plasma Glucose: 240 mg/dL

## 2016-01-14 MED ORDER — ATORVASTATIN CALCIUM 20 MG PO TABS
20.0000 mg | ORAL_TABLET | Freq: Every day | ORAL | Status: DC
  Administered 2016-01-14 – 2016-01-15 (×2): 20 mg via ORAL
  Filled 2016-01-14 (×2): qty 1

## 2016-01-14 MED ORDER — SODIUM CHLORIDE 0.9 % IV SOLN
INTRAVENOUS | Status: DC
  Administered 2016-01-14 – 2016-01-15 (×2): via INTRAVENOUS

## 2016-01-14 MED ORDER — LIVING WELL WITH DIABETES BOOK
Freq: Once | Status: AC
  Administered 2016-01-14: 11:00:00
  Filled 2016-01-14: qty 1

## 2016-01-14 NOTE — Plan of Care (Signed)
Problem: Food- and Nutrition-Related Knowledge Deficit (NB-1.1) Goal: Nutrition education Formal process to instruct or train a patient/client in a skill or to impart knowledge to help patients/clients voluntarily manage or modify food choices and eating behavior to maintain or improve health. Outcome: Completed/Met Date Met: 01/14/16  RD consulted for nutrition education regarding diabetes.   Lab Results  Component Value Date   HGBA1C 10.0 (H) 01/13/2016   CBG: 281-539 past 24 hrs  Met with patient and wife at bedside. Francisco Rice is a 56 year old male with new diagnosis of type 2 diabetes mellitus. 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 He reports he typically eats 2-3 meals per day. If he eats at work he may have a grilled chicken sandwich on an [place] muffin with egg and cheese or he may have [place] sausage sandwich with eggs and cheese. If patient goes to another restaurant (typically [employer]) he would get two fried chicken sandwiches. Wife also reports that in addition to the sandwiches patient would also order a large sweet tea and large order of french fries. Patient and wife very engaged in education, but report this will be a big change for patient.   Patient's Self-Chosen Goals: 1. Reduce intake of sweet tea and other sugar-sweetened beverages. 2. Utilize carbohydrate counting to choose appropriate portion sizes of meals and snacks.   RD provided "Carbohydrate Counting for People with Diabetes" handout from the Academy of Nutrition and Dietetics. Discussed different food groups and their effects on blood sugar, emphasizing carbohydrate-containing foods. Provided list of carbohydrates and recommended serving sizes of common foods.  Discussed importance of controlled and consistent carbohydrate intake throughout the day. Provided examples of ways to balance meals/snacks and encouraged intake of high-fiber, whole grain complex carbohydrates.  Teach back method used.  Diabetes Coordinator will be setting up referral to receive outpatient counseling from RD for diabetes. Agree that patient would benefit from this to implement lifestyle changes.  Expect good compliance.  Body mass index is 42.1 kg/m. Pt meets criteria for Obesity Class III based on current BMI.  Current diet order is Heart Healthy/Carbohydrate Modified, patient is consuming approximately 100% of meals at this time. Labs and medications reviewed. No further nutrition interventions warranted at this time. RD contact information provided. If additional nutrition issues arise, please re-consult RD.  Willey Blade, MS, RD, LDN Pager: [phone] After Hours Pager: [phone]

## 2016-01-14 NOTE — Progress Notes (Signed)
Patient admitted to floor;alert and oriented;denies pain at this time; spouse by side; Will continue to monitor

## 2016-01-14 NOTE — Progress Notes (Addendum)
Inpatient Diabetes Program Recommendations  AACE/ADA: New Consensus Statement on Inpatient Glycemic Control (2015)  Target Ranges:  Prepandial:   less than 140 mg/dL      Peak postprandial:   less than 180 mg/dL (1-2 hours)      Critically ill patients:  140 - 180 mg/dL   Lab Results  Component Value Date   GLUCAP 281 (H) 01/14/2016   HGBA1C 10.0 (H) 01/13/2016    Review of Glycemic Control  Results for Imagene GurneyMONTAGUE, Jahshua (MRN [id number]) as of 01/14/2016 08:50  Ref. Range 01/13/2016 18:09 01/13/2016 19:57 01/13/2016 21:41 01/13/2016 22:32 01/14/2016 08:07  Glucose-Capillary Latest Ref Range: 65 - 99 mg/dL 244350 (H) 010345 (H) 272346 (H) 349 (H) 281 (H)    Diabetes history: New diagnosis Type 2 Outpatient Diabetes medications: none Current orders for Inpatient glycemic control:Lantus 20 units qday, Novolog 0-9 units tid, Novolog 0-5 units qhs  Inpatient Diabetes Program Recommendations:  Please add diabetes to the problem list- this will allow us to send a referral for outpatient diabetes education post discharge.    Consider increasing Novolog correction to moderate correction scale.    Spoke with patient and his wife about diabetes.  Reviewed basic meal planning, A1C, basic physiology,  medications and potential for long term complications.  Discussed diabetes medications and the benefit of insulin, the importance of medical follow up and the need for exercise. Questions by patient and wife answered.  I will follow up with them tomorrow and have encouraged them to write any questions down for me.  Susette RacerJulie Jermario Kalmar, RN, BA, MHA, CDE Diabetes Coordinator Inpatient Diabetes Program  [phone] (Team Pager) [phone] [facility] Office) 01/14/2016 2:52 PM

## 2016-01-14 NOTE — Progress Notes (Signed)
[employer] [facility] - [place] at [facility]   PATIENT NAME: Francisco Rice    MR#:  [id number]  DATE OF BIRTH:  February 07, 1960  SUBJECTIVE:  CHIEF COMPLAINT:   Chief Complaint  Patient presents with  . Hypertension    Came with uncontrolled hypertension and headache, and had uncontrolled diabetes also. He had right upper extremity numbness with that which is resolved now with proper control of blood pressure.   REVIEW OF SYSTEMS:  CONSTITUTIONAL: No fever, fatigue or weakness.  EYES: No blurred or double vision.  EARS, NOSE, AND THROAT: No tinnitus or ear pain.  RESPIRATORY: No cough, shortness of breath, wheezing or hemoptysis.  CARDIOVASCULAR: No chest pain, orthopnea, edema.  GASTROINTESTINAL: No nausea, vomiting, diarrhea or abdominal pain.  GENITOURINARY: No dysuria, hematuria.  ENDOCRINE: No polyuria, nocturia,  HEMATOLOGY: No anemia, easy bruising or bleeding SKIN: No rash or lesion. MUSCULOSKELETAL: No joint pain or arthritis.   NEUROLOGIC: No tingling, numbness, weakness.  PSYCHIATRY: No anxiety or depression.   ROS  DRUG ALLERGIES:  No Known Allergies  VITALS:  Blood pressure 97/60, pulse 64, temperature 97.4 F (36.3 C), temperature source Oral, resp. rate 20, height 6\' 1"  (1.854 m), weight (!) 144.7 kg (319 lb 1.6 oz), SpO2 96 %.  PHYSICAL EXAMINATION:  GENERAL:  56 y.o.-year-old obese patient lying in the bed with no acute distress.  EYES: Pupils equal, round, reactive to light and accommodation. No scleral icterus. Extraocular muscles intact.  HEENT: Head atraumatic, normocephalic. Oropharynx and nasopharynx clear.  NECK:  Supple, no jugular venous distention. No thyroid enlargement, no tenderness.  LUNGS: Normal breath sounds bilaterally, no wheezing, rales,rhonchi or crepitation. No use of accessory muscles of respiration.  CARDIOVASCULAR: S1, S2 normal. No murmurs, rubs, or gallops.  ABDOMEN: Soft, nontender, nondistended. Bowel sounds present. No  organomegaly or mass.  EXTREMITIES: No pedal edema, cyanosis, or clubbing.  NEUROLOGIC: Cranial nerves II through XII are intact. Muscle strength 5/5 in all extremities. Sensation intact. Gait not checked.  PSYCHIATRIC: The patient is alert and oriented x 3.  SKIN: No obvious rash, lesion, or ulcer.   Physical Exam LABORATORY PANEL:   CBC  Recent Labs Lab 01/14/16 0415  WBC 15.4*  HGB 15.2  HCT 44.5  PLT 262   ------------------------------------------------------------------------------------------------------------------  Chemistries   Recent Labs Lab 01/13/16 0917 01/14/16 0415  NA 131* 136  K 4.6 3.7  CL 96* 105  CO2 23 24  GLUCOSE 749* 310*  BUN 18 22*  CREATININE 1.51* 2.15*  CALCIUM 9.0 8.7*  AST 34  --   ALT 39  --   ALKPHOS 110  --   BILITOT 1.5*  --    ------------------------------------------------------------------------------------------------------------------  Cardiac Enzymes  Recent Labs Lab 01/13/16 0917  TROPONINI <0.03   ------------------------------------------------------------------------------------------------------------------  RADIOLOGY:  Ct Head Wo Contrast  Result Date: 01/13/2016 CLINICAL DATA:  High blood pressure. Visual disturbance since yesterday. EXAM: CT HEAD WITHOUT CONTRAST TECHNIQUE: Contiguous axial images were obtained from the base of the skull through the vertex without intravenous contrast. COMPARISON:  01/06/2016 FINDINGS: Brain: Mild volume loss. No acute intracranial abnormality. Specifically, no hemorrhage, hydrocephalus, mass lesion, acute infarction, or significant intracranial injury. Vascular: No hyperdense vessel or unexpected calcification. Skull: No acute calvarial abnormality. Sinuses/Orbits: Visualized paranasal sinuses and mastoids clear. Orbital soft tissues unremarkable. Other: None IMPRESSION: No acute intracranial abnormality. Electronically Signed   By: Charlett NoseKevin  Dover M.D.   On: 01/13/2016 09:36   Mr  Brain Wo Contrast  Result Date: 01/13/2016 CLINICAL DATA:  Hypertensive encephalopathy. Headaches for 2-3 weeks. EXAM: MRI HEAD WITHOUT CONTRAST TECHNIQUE: Multiplanar, multiecho pulse sequences of the brain and surrounding structures were obtained without intravenous contrast. COMPARISON:  Head CT 01/13/2016 FINDINGS: The study is mildly motion degraded. Brain: There is no evidence of acute infarct, intracranial hemorrhage, mass, midline shift, or extra-axial fluid collection. A cavum septum pellucidum et vergae is again incidentally noted. There is mild cerebral atrophy. Periventricular white matter T2 hyperintensities are nonspecific but compatible with minimal chronic small vessel ischemic disease. No signal abnormalities suggestive of PRES are identified. Vascular: Major intracranial vascular flow voids are preserved, with the left vertebral artery being dominant. Skull and upper cervical spine: No focal marrow lesion. Sinuses/Orbits: Unremarkable orbits. Mild paranasal sinus mucosal thickening, greatest in the left maxillary sinus. Clear mastoid air cells. Other: None. IMPRESSION: 1. No acute intracranial abnormality. 2. Mild cerebral atrophy and minimal chronic small vessel ischemic disease. Electronically Signed   By: Sebastian Ache M.D.   On: 01/13/2016 16:45   US Renal  Result Date: 01/14/2016 CLINICAL DATA:  Acute renal failure EXAM: RENAL / URINARY TRACT ULTRASOUND COMPLETE COMPARISON:  None. FINDINGS: Right Kidney: Length: 10.3 cm. Echogenicity and renal cortical thickness are within normal limits. No mass, perinephric fluid, or hydronephrosis visualized. No sonographically demonstrable calculus or ureterectasis. Left Kidney: Length: 12.3 cm. Echogenicity is mildly increased. Renal cortical thickness is within normal limits. No mass, perinephric fluid, or hydronephrosis visualized. No sonographically demonstrable calculus or ureterectasis. Bladder: Appears normal for degree of bladder distention.  Liver echogenicity overall is increased. IMPRESSION: Left kidney echogenicity is mildly increased. This finding may be seen with medical renal disease. No obstructing foci identified in either kidney. There is borderline size discrepancy between the kidneys. This finding potentially could indicate a degree of renal artery stenosis on the right. In this regard, question whether patient is hypertensive. Incidental note is made of increased liver echogenicity, a finding most likely indicative of hepatic steatosis. Electronically Signed   By: Bretta Bang III M.D.   On: 01/14/2016 15:58    ASSESSMENT AND PLAN:   Active Problems:   Hypertensive encephalopathy  1. Hypertensive encephalopathy.   Clonidine, amlodipine, lisinopril.   Now blood pressure is under control.   Patient has headaches resolved now and no numbness. 2. Confusion and headache.    MRI head is negative now patient is fine. 3. Type 2 diabetes mellitus , new diagnosis.    High hemoglobin A1c. Started on sliding scale and Lantus 20 units daily.    Counseled about diet control and got dietary consult. 4. Obesity weight loss needed 5. Dehydration and acute kidney injury and hyponatremia fluid bolus ordered. Hyponatremia secondary to high sugar also.   Acute kidney injury could be secondary to his extremely high blood pressure, we would like to monitor for next 1 or 2 days.    All the records are reviewed and case discussed with Care Management/Social Workerr. Management plans discussed with the patient, family and they are in agreement.  CODE STATUS: full.  TOTAL TIME TAKING CARE OF THIS PATIENT: 35 minutes.   POSSIBLE D/C IN 1-2 DAYS, DEPENDING ON CLINICAL CONDITION.   Altamese Dilling M.D on 01/14/2016   Between 7am to 6pm - Pager - [phone]  After 6pm go to [email or web address] - password [email or web address]  [facility]  Office  [phone]  CC: Primary care physician; Luna Fuse,  MD  Note: This dictation was prepared with Dragon dictation along with smaller phrase technology. Any transcriptional errors  that result from this process are unintentional.

## 2016-01-14 NOTE — Progress Notes (Signed)
CH responded to an OR for an AD. Pt was in bed, awake and alert. Wife was bedside. CH educated Pt on the AD. Pt wants time to look it over before completion. CH is available for follow up as needed.    01/14/16 1000  Clinical Encounter Type  Visited With Patient;Patient and family together  Visit Type Initial;Spiritual support  Referral From Nurse  Spiritual Encounters  Spiritual Needs Literature

## 2016-01-15 LAB — BASIC METABOLIC PANEL
Anion gap: 8 (ref 5–15)
BUN: 38 mg/dL — AB (ref 6–20)
CO2: 24 mmol/L (ref 22–32)
CREATININE: 2.34 mg/dL — AB (ref 0.61–1.24)
Calcium: 8.2 mg/dL — ABNORMAL LOW (ref 8.9–10.3)
Chloride: 100 mmol/L — ABNORMAL LOW (ref 101–111)
GFR, EST AFRICAN AMERICAN: 35 mL/min — AB (ref >60)
GFR, EST NON AFRICAN AMERICAN: 30 mL/min — AB (ref >60)
Glucose, Bld: 294 mg/dL — ABNORMAL HIGH (ref 65–99)
Potassium: 3.3 mmol/L — ABNORMAL LOW (ref 3.5–5.1)
SODIUM: 132 mmol/L — AB (ref 135–145)

## 2016-01-15 LAB — GLUCOSE, CAPILLARY
GLUCOSE-CAPILLARY: 278 mg/dL — AB (ref 65–99)
Glucose-Capillary: 287 mg/dL — ABNORMAL HIGH (ref 65–99)
Glucose-Capillary: 265 mg/dL — ABNORMAL HIGH (ref 65–99)
Glucose-Capillary: 255 mg/dL — ABNORMAL HIGH (ref 65–99)

## 2016-01-15 LAB — CBC
HCT: 42.2 % (ref 40.0–52.0)
Hemoglobin: 15 g/dL (ref 13.0–18.0)
MCH: 29 pg (ref 26.0–34.0)
MCHC: 35.6 g/dL (ref 32.0–36.0)
MCV: 81.5 fL (ref 80.0–100.0)
Platelets: 220 10*3/uL (ref 150–440)
RBC: 5.18 MIL/uL (ref 4.40–5.90)
RDW: 13.8 % (ref 11.5–14.5)
WBC: 10.3 10*3/uL (ref 3.8–10.6)

## 2016-01-15 MED ORDER — AMLODIPINE BESYLATE 5 MG PO TABS
5.0000 mg | ORAL_TABLET | Freq: Every day | ORAL | Status: DC
  Administered 2016-01-15 – 2016-01-16 (×2): 5 mg via ORAL
  Filled 2016-01-15 (×2): qty 1

## 2016-01-15 MED ORDER — CLONIDINE HCL 0.1 MG PO TABS
0.1000 mg | ORAL_TABLET | Freq: Two times a day (BID) | ORAL | Status: DC
  Administered 2016-01-15 – 2016-01-16 (×3): 0.1 mg via ORAL
  Filled 2016-01-15 (×3): qty 1

## 2016-01-15 MED ORDER — INSULIN STARTER KIT- PEN NEEDLES (ENGLISH)
1.0000 | Freq: Once | Status: AC
  Administered 2016-01-15: 1
  Filled 2016-01-15: qty 1

## 2016-01-15 MED ORDER — INSULIN GLARGINE 100 UNIT/ML ~~LOC~~ SOLN
30.0000 [IU] | Freq: Every day | SUBCUTANEOUS | Status: DC
  Administered 2016-01-15 – 2016-01-16 (×2): 30 [IU] via SUBCUTANEOUS
  Filled 2016-01-15 (×2): qty 0.3

## 2016-01-15 NOTE — Consult Note (Signed)
[facility] CONSULT NOTE    Date: 01/15/2016                  Patient Name:  Francisco Rice  MRN: [id number]  DOB: 1960/01/29  Age / Sex: 56 y.o., male         PCP: Luna Fuse, MD                 Service Requesting Consult: Hospitalist                 Reason for Consult: Acute renal failure            History of Present Illness: Patient is a 56 y.o. male with a PMHx of Hypertension, right foot injury, gallop, who was admitted to [facility] on 01/13/2016 for evaluation of Headache and uncontrolled hypertension.  Recently the patient has noted significantly elevated blood pressure.  He had prior emergency room visit for the same issue.  His headaches persisted and therefore he came back to the hospital.  His systolic blood pressure was 176 upon arrival.  In addition he was also found to have new onset diabetes mellitus type 2 with a blood sugar greater than 700.  His baseline creatinine appears to be 1.27.  Upon presentation this time creatinine was 1.5 and has risen to 2.34.  Patient was given antihypertensives during the course of this admission and his blood pressure has come down significantly which may have altered renal perfusion.  Patient had some kidney size discrepancy about 2 cm which is acceptable.   Medications: Outpatient medications: Prescriptions Prior to Admission  Medication Sig Dispense Refill Last Dose  . amLODipine (NORVASC) 5 MG tablet Take 1 tablet (5 mg total) by mouth daily. 30 tablet 0 01/13/2016 at 0900  . cloNIDine (CATAPRES) 0.2 MG tablet Take 1 tablet (0.2 mg total) by mouth 2 (two) times daily as needed (hypertension). (Patient not taking: Reported on 01/13/2016) 60 tablet 0 Not Taking at Unknown time  . Elastic Bandages & Supports (T.E.D. BELOW KNEE/L-REGULAR) MISC 1 application by Does not apply route daily at 6 (six) AM. 4 each 0     Current medications: Current Facility-Administered Medications  Medication Dose Route  Frequency Provider Last Rate Last Dose  . 0.9 %  sodium chloride infusion   Intravenous Continuous Altamese Dilling, MD 75 mL/hr at 01/15/16 0726    . acetaminophen (TYLENOL) tablet 650 mg  650 mg Oral Q6H PRN Alford Highland, MD       Or  . acetaminophen (TYLENOL) suppository 650 mg  650 mg Rectal Q6H PRN Alford Highland, MD      . amLODipine (NORVASC) tablet 5 mg  5 mg Oral Daily Altamese Dilling, MD   5 mg at 01/15/16 1059  . aspirin EC tablet 81 mg  81 mg Oral Daily Alford Highland, MD   81 mg at 01/15/16 1059  . atorvastatin (LIPITOR) tablet 20 mg  20 mg Oral q1800 Altamese Dilling, MD   20 mg at 01/14/16 1736  . cloNIDine (CATAPRES) tablet 0.1 mg  0.1 mg Oral BID Altamese Dilling, MD   0.1 mg at 01/15/16 1059  . enoxaparin (LOVENOX) injection 40 mg  40 mg Subcutaneous Q24H Alford Highland, MD   40 mg at 01/15/16 1059  . insulin aspart (novoLOG) injection 0-5 Units  0-5 Units Subcutaneous QHS Alford Highland, MD   4 Units at 01/14/16 2143  . insulin aspart (novoLOG) injection 0-9 Units  0-9 Units Subcutaneous TID WC Richard  Wieting, MD   5 Units at 01/15/16 1217  . insulin glargine (LANTUS) injection 30 Units  30 Units Subcutaneous Daily Altamese Dilling, MD   30 Units at 01/15/16 1059  . labetalol (NORMODYNE) tablet 100 mg  100 mg Oral BID Alford Highland, MD   100 mg at 01/15/16 1059  . ondansetron (ZOFRAN) injection 4 mg  4 mg Intravenous Q6H PRN Alford Highland, MD   4 mg at 01/13/16 1903  . oxyCODONE (Oxy IR/ROXICODONE) immediate release tablet 5 mg  5 mg Oral Q4H PRN Alford Highland, MD      . sodium chloride flush (NS) 0.9 % injection 3 mL  3 mL Intravenous Q12H Alford Highland, MD   3 mL at 01/14/16 1038      Allergies: No Known Allergies    Past Medical History: Past Medical History:  Diagnosis Date  . Hypertension      Past Surgical History: Past Surgical History:  Procedure Laterality Date  . APPENDECTOMY       Family History: Family  History  Problem Relation Age of Onset  . Healthy Mother   . Parkinson's disease Father      Social History: Social History   Social History  . Marital status: Married    Spouse name: N/A  . Number of children: N/A  . Years of education: N/A   Occupational History  . Not on file.   Social History Main Topics  . Smoking status: Never Smoker  . Smokeless tobacco: Never Used  . Alcohol use No  . Drug use: No  . Sexual activity: Not on file   Other Topics Concern  . Not on file   Social History Narrative  . No narrative on file     Review of Systems: Review of Systems  Constitutional: Positive for malaise/fatigue. Negative for chills, fever and weight loss.  HENT: Negative for ear pain, hearing loss and tinnitus.   Eyes: Negative for blurred vision and double vision.  Respiratory: Negative for cough, hemoptysis and sputum production.   Cardiovascular: Negative for chest pain, palpitations and orthopnea.  Gastrointestinal: Negative for heartburn, nausea and vomiting.  Genitourinary: Negative for dysuria, frequency and urgency.  Musculoskeletal: Negative for myalgias.  Skin: Negative for itching and rash.  Neurological: Positive for weakness and headaches.  Endo/Heme/Allergies: Positive for polydipsia. Does not bruise/bleed easily.  Psychiatric/Behavioral: Negative for depression and hallucinations. The patient is not nervous/anxious.      Vital Signs: Blood pressure 113/72, pulse 72, temperature 97.9 F (36.6 C), temperature source Oral, resp. rate 20, height 6\' 1"  (1.854 m), weight (!) 144.7 kg (319 lb 1.6 oz), SpO2 97 %.  Weight trends: Filed Weights   01/13/16 0905 01/13/16 2316  Weight: (!) 149.7 kg (330 lb) (!) 144.7 kg (319 lb 1.6 oz)    Physical Exam: General: NAD,   Head: Normocephalic, atraumatic.  Eyes: Anicteric, EOMI  Nose: Mucous membranes moist, not inflammed, nonerythematous.  Throat: Oropharynx nonerythematous, no exudate appreciated.    Neck: Supple, trachea midline.  Lungs:  Normal respiratory effort. Clear to auscultation BL without crackles or wheezes.  Heart: RRR. S1 and S2 normal without gallop, murmur, or rubs.  Abdomen:  BS normoactive. Soft, Nondistended, non-tender.  No masses or organomegaly.  Extremities: No pretibial edema.  Neurologic: A&O X3, Motor strength is 5/5 in the all 4 extremities  Skin: No visible rashes, scars.    Lab results: Basic Metabolic Panel:  Recent Labs Lab 01/13/16 0917 01/14/16 0415 01/15/16 0418  NA 131* 136  132*  K 4.6 3.7 3.3*  CL 96* 105 100*  CO2 23 24 24   GLUCOSE 749* 310* 294*  BUN 18 22* 38*  CREATININE 1.51* 2.15* 2.34*  CALCIUM 9.0 8.7* 8.2*    Liver Function Tests:  Recent Labs Lab 01/13/16 0917  AST 34  ALT 39  ALKPHOS 110  BILITOT 1.5*  PROT 7.9  ALBUMIN 4.5   No results for input(s): LIPASE, AMYLASE in the last 168 hours. No results for input(s): AMMONIA in the last 168 hours.  CBC:  Recent Labs Lab 01/13/16 0014 01/13/16 1230 01/14/16 0415 01/15/16 0418  WBC 10.2 12.5* 15.4* 10.3  NEUTROABS  --  9.5*  --   --   HGB 18.4* 17.2 15.2 15.0  HCT 53.1* 50.3 44.5 42.2  MCV 82.3 83.6 82.6 81.5  PLT 263 247 262 220    Cardiac Enzymes:  Recent Labs Lab 01/13/16 0917  TROPONINI <0.03    BNP: Invalid input(s): POCBNP  CBG:  Recent Labs Lab 01/14/16 1154 01/14/16 1639 01/14/16 2127 01/15/16 0747 01/15/16 1150  GLUCAP 366* 356* 301* 278* 287*    Microbiology: No results found for this or any previous visit.  Coagulation Studies:  Recent Labs  01/13/16 1257  LABPROT 11.9  INR 0.88    Urinalysis:  Recent Labs  01/13/16 1257  COLORURINE STRAW*  LABSPEC 1.028  PHURINE 7.0  GLUCOSEU >=500*  HGBUR NEGATIVE  BILIRUBINUR NEGATIVE  KETONESUR NEGATIVE  PROTEINUR NEGATIVE  NITRITE NEGATIVE  LEUKOCYTESUR NEGATIVE      Imaging: Mr Brain Wo Contrast  Result Date: 01/13/2016 CLINICAL DATA:  Hypertensive  encephalopathy. Headaches for 2-3 weeks. EXAM: MRI HEAD WITHOUT CONTRAST TECHNIQUE: Multiplanar, multiecho pulse sequences of the brain and surrounding structures were obtained without intravenous contrast. COMPARISON:  Head CT 01/13/2016 FINDINGS: The study is mildly motion degraded. Brain: There is no evidence of acute infarct, intracranial hemorrhage, mass, midline shift, or extra-axial fluid collection. A cavum septum pellucidum et vergae is again incidentally noted. There is mild cerebral atrophy. Periventricular white matter T2 hyperintensities are nonspecific but compatible with minimal chronic small vessel ischemic disease. No signal abnormalities suggestive of PRES are identified. Vascular: Major intracranial vascular flow voids are preserved, with the left vertebral artery being dominant. Skull and upper cervical spine: No focal marrow lesion. Sinuses/Orbits: Unremarkable orbits. Mild paranasal sinus mucosal thickening, greatest in the left maxillary sinus. Clear mastoid air cells. Other: None. IMPRESSION: 1. No acute intracranial abnormality. 2. Mild cerebral atrophy and minimal chronic small vessel ischemic disease. Electronically Signed   By: Sebastian AcheAllen  Grady M.D.   On: 01/13/2016 16:45   [place] Renal  Result Date: 01/14/2016 CLINICAL DATA:  Acute renal failure EXAM: RENAL / URINARY TRACT ULTRASOUND COMPLETE COMPARISON:  None. FINDINGS: Right Kidney: Length: 10.3 cm. Echogenicity and renal cortical thickness are within normal limits. No mass, perinephric fluid, or hydronephrosis visualized. No sonographically demonstrable calculus or ureterectasis. Left Kidney: Length: 12.3 cm. Echogenicity is mildly increased. Renal cortical thickness is within normal limits. No mass, perinephric fluid, or hydronephrosis visualized. No sonographically demonstrable calculus or ureterectasis. Bladder: Appears normal for degree of bladder distention. Liver echogenicity overall is increased. IMPRESSION: Left kidney  echogenicity is mildly increased. This finding may be seen with medical renal disease. No obstructing foci identified in either kidney. There is borderline size discrepancy between the kidneys. This finding potentially could indicate a degree of renal artery stenosis on the right. In this regard, question whether patient is hypertensive. Incidental note is made of increased liver  echogenicity, a finding most likely indicative of hepatic steatosis. Electronically Signed   By: Bretta Bang III M.D.   On: 01/14/2016 15:58      Assessment & Plan: Pt is a 56 y.o. male with a PMHx of Hypertension, right foot injury, gallop, who was admitted to [facility] on 01/13/2016 for evaluation of Headache and uncontrolled hypertension.   1.  Acute renal failure due to new onset diabetes/polyuria and normalization of elevated blood pressure. 2.  Diabetes mellitus type 2 without complication. 3. Hypokalemia. 4. Hypertension.  Plan: Patient presented to [facility] with headaches and hypertension.  He was also diagnosed with new-onset diabetes mellitus type 2.  We were consulted for the evaluation management of acute renal failure.  Suspect that acute renal failure was secondary to intravascular volume depletion from new-onset diabetes as well as normalization of elevated blood pressure that was noted at admission.  For now continue IV fluid hydration.  In addition okay to continue amlodipine as well as clonidine for blood pressure control for now.  There was borderline kidney size discrepancy noted however 2 cm of kidney size discrepancy is allowable.  If blood pressure control 10 used to be high as an outpatient we may consider renal artery duplex but hold off for now. Avoid nephrotoxins as possible in the recovery period.

## 2016-01-15 NOTE — Progress Notes (Signed)
Inpatient Diabetes Program Recommendations  AACE/ADA: New Consensus Statement on Inpatient Glycemic Control (2015)  Target Ranges:  Prepandial:   less than 140 mg/dL      Peak postprandial:   less than 180 mg/dL (1-2 hours)      Critically ill patients:  140 - 180 mg/dL   Lab Results  Component Value Date   GLUCAP 278 (H) 01/15/2016   HGBA1C 10.0 (H) 01/13/2016    Review of Glycemic Control  Results for Imagene GurneyMONTAGUE, Jaspal (MRN [id number]) as of 01/15/2016 07:51  Ref. Range 01/14/2016 08:07 01/14/2016 11:54 01/14/2016 16:39 01/14/2016 21:27 01/15/2016 07:47  Glucose-Capillary Latest Ref Range: 65 - 99 mg/dL 621281 (H) 308366 (H) 657356 (H) 301 (H) 278 (H)     Diabetes history: New diagnosis Type 2 Outpatient Diabetes medications: none Current orders for Inpatient glycemic control:Lantus 20 units qday, Novolog 0-9 units tid, Novolog 0-5 units qhs  Inpatient Diabetes Program Recommendations:  Please add diabetes to the problem list- this will allow us to send a referral for outpatient diabetes education post discharge.    Consider increasing Novolog correction to moderate correction scale.   Consider increasing Lantus to 30 units qday (0.21 units/kg) and adding Novolog 4 units tid with meals.   RN- please begin teaching the patient about insulin administration and allow him to give himself his own injections.  Susette RacerJulie Chikita Dogan, RN, BA, MHA, CDE Diabetes Coordinator Inpatient Diabetes Program  [phone] (Team Pager) [phone] [facility] Office) 01/15/2016 7:53 AM

## 2016-01-15 NOTE — Progress Notes (Signed)
Inpatient Diabetes Program Recommendations  AACE/ADA: New Consensus Statement on Inpatient Glycemic Control (2015)  Target Ranges:  Prepandial:   less than 140 mg/dL      Peak postprandial:   less than 180 mg/dL (1-2 hours)      Critically ill patients:  140 - 180 mg/dL   Lab Results  Component Value Date   GLUCAP 287 (H) 01/15/2016   HGBA1C 10.0 (H) 01/13/2016    Reviewed the use of the insulin pen including storage, site rotaion and timing of both mealtime and basal insulin.  Patient was able to give me a return demonstration on how to use it.  Reviewed the insulin pen kit with the patient, blood sugar record, glucose tabs and written instructions on the insulin administration.  Reviewed the plate method of eating, the treatment of hypoglycemia.  Patient was able to describe how to treat a low blood sugars.  All questions answered- encouraged to ask for the diabetes coordinator if they had further questions.  Patient will need a prescription for; a glucometer, lancets, testing strips      Insulin pen needles      Insulin pens      alcohol prep pad at discharge.   Gentry Fitz, RN, BA, MHA, CDE Diabetes Coordinator Inpatient Diabetes Program  [phone] (Team Pager) [phone] (Deputy) 01/15/2016 3:11 PM

## 2016-01-15 NOTE — Progress Notes (Signed)
[facility] - [place] [facility]   PATIENT NAME: Francisco Rice    MR#:  [id number]  DATE OF BIRTH:  Feb 12, 1960  SUBJECTIVE:  CHIEF COMPLAINT:   Chief Complaint  Patient presents with  . Hypertension    Came with uncontrolled hypertension and headache, and had uncontrolled diabetes also. He had right upper extremity numbness with that which is resolved now with proper control of blood pressure. Renal func continue to get worse.   REVIEW OF SYSTEMS:  CONSTITUTIONAL: No fever, fatigue or weakness.  EYES: No blurred or double vision.  EARS, NOSE, AND THROAT: No tinnitus or ear pain.  RESPIRATORY: No cough, shortness of breath, wheezing or hemoptysis.  CARDIOVASCULAR: No chest pain, orthopnea, edema.  GASTROINTESTINAL: No nausea, vomiting, diarrhea or abdominal pain.  GENITOURINARY: No dysuria, hematuria.  ENDOCRINE: No polyuria, nocturia,  HEMATOLOGY: No anemia, easy bruising or bleeding SKIN: No rash or lesion. MUSCULOSKELETAL: No joint pain or arthritis.   NEUROLOGIC: No tingling, numbness, weakness.  PSYCHIATRY: No anxiety or depression.   ROS  DRUG ALLERGIES:  No Known Allergies  VITALS:  Blood pressure 113/72, pulse 72, temperature 97.9 F (36.6 C), temperature source Oral, resp. rate 20, height 6\' 1"  (1.854 m), weight (!) 144.7 kg (319 lb 1.6 oz), SpO2 97 %.  PHYSICAL EXAMINATION:  GENERAL:  56 y.o.-year-old obese patient lying in the bed with no acute distress.  EYES: Pupils equal, round, reactive to light and accommodation. No scleral icterus. Extraocular muscles intact.  HEENT: Head atraumatic, normocephalic. Oropharynx and nasopharynx clear.  NECK:  Supple, no jugular venous distention. No thyroid enlargement, no tenderness.  LUNGS: Normal breath sounds bilaterally, no wheezing, rales,rhonchi or crepitation. No use of accessory muscles of respiration.  CARDIOVASCULAR: S1, S2 normal. No murmurs, rubs, or gallops.  ABDOMEN: Soft, nontender,  nondistended. Bowel sounds present. No organomegaly or mass.  EXTREMITIES: No pedal edema, cyanosis, or clubbing.  NEUROLOGIC: Cranial nerves II through XII are intact. Muscle strength 5/5 in all extremities. Sensation intact. Gait not checked.  PSYCHIATRIC: The patient is alert and oriented x 3.  SKIN: No obvious rash, lesion, or ulcer.   Physical Exam LABORATORY PANEL:   CBC  Recent Labs Lab 01/15/16 0418  WBC 10.3  HGB 15.0  HCT 42.2  PLT 220   ------------------------------------------------------------------------------------------------------------------  Chemistries   Recent Labs Lab 01/13/16 0917  01/15/16 0418  NA 131*  < > 132*  K 4.6  < > 3.3*  CL 96*  < > 100*  CO2 23  < > 24  GLUCOSE 749*  < > 294*  BUN 18  < > 38*  CREATININE 1.51*  < > 2.34*  CALCIUM 9.0  < > 8.2*  AST 34  --   --   ALT 39  --   --   ALKPHOS 110  --   --   BILITOT 1.5*  --   --   < > = values in this interval not displayed. ------------------------------------------------------------------------------------------------------------------  Cardiac Enzymes  Recent Labs Lab 01/13/16 0917  TROPONINI <0.03   ------------------------------------------------------------------------------------------------------------------  RADIOLOGY:  Mr Brain Wo Contrast  Result Date: 01/13/2016 CLINICAL DATA:  Hypertensive encephalopathy. Headaches for 2-3 weeks. EXAM: MRI HEAD WITHOUT CONTRAST TECHNIQUE: Multiplanar, multiecho pulse sequences of the brain and surrounding structures were obtained without intravenous contrast. COMPARISON:  Head CT 01/13/2016 FINDINGS: The study is mildly motion degraded. Brain: There is no evidence of acute infarct, intracranial hemorrhage, mass, midline shift, or extra-axial fluid collection. A cavum septum pellucidum et  vergae is again incidentally noted. There is mild cerebral atrophy. Periventricular white matter T2 hyperintensities are nonspecific but compatible  with minimal chronic small vessel ischemic disease. No signal abnormalities suggestive of PRES are identified. Vascular: Major intracranial vascular flow voids are preserved, with the left vertebral artery being dominant. Skull and upper cervical spine: No focal marrow lesion. Sinuses/Orbits: Unremarkable orbits. Mild paranasal sinus mucosal thickening, greatest in the left maxillary sinus. Clear mastoid air cells. Other: None. IMPRESSION: 1. No acute intracranial abnormality. 2. Mild cerebral atrophy and minimal chronic small vessel ischemic disease. Electronically Signed   By: Sebastian AcheAllen  Grady M.D.   On: 01/13/2016 16:45   [place] Renal  Result Date: 01/14/2016 CLINICAL DATA:  Acute renal failure EXAM: RENAL / URINARY TRACT ULTRASOUND COMPLETE COMPARISON:  None. FINDINGS: Right Kidney: Length: 10.3 cm. Echogenicity and renal cortical thickness are within normal limits. No mass, perinephric fluid, or hydronephrosis visualized. No sonographically demonstrable calculus or ureterectasis. Left Kidney: Length: 12.3 cm. Echogenicity is mildly increased. Renal cortical thickness is within normal limits. No mass, perinephric fluid, or hydronephrosis visualized. No sonographically demonstrable calculus or ureterectasis. Bladder: Appears normal for degree of bladder distention. Liver echogenicity overall is increased. IMPRESSION: Left kidney echogenicity is mildly increased. This finding may be seen with medical renal disease. No obstructing foci identified in either kidney. There is borderline size discrepancy between the kidneys. This finding potentially could indicate a degree of renal artery stenosis on the right. In this regard, question whether patient is hypertensive. Incidental note is made of increased liver echogenicity, a finding most likely indicative of hepatic steatosis. Electronically Signed   By: Bretta BangWilliam  Woodruff III M.D.   On: 01/14/2016 15:58    ASSESSMENT AND PLAN:   Active Problems:   Hypertensive  encephalopathy  1. Hypertensive encephalopathy.   Clonidine, amlodipine, lisinopril.   Now blood pressure is under control.   Patient has headaches resolved now and no numbness.   Blood pressure is on lower normal side, decreased dose of meds as renal func is compromized. 2. Confusion and headache.    MRI head is negative now patient is fine. 3. Type 2 diabetes mellitus , new diagnosis.    High hemoglobin A1c. Started on sliding scale and Lantus 20 units daily.    Counseled about diet control and got dietary consult. 4. Obesity weight loss needed 5. Dehydration and acute kidney injury and hyponatremia fluid bolus ordered. Hyponatremia secondary to high sugar also.   Acute kidney injury could be secondary to his extremely high blood pressure, we would like to monitor for next 1 or 2 days.   Called nephro consult.   All the records are reviewed and case discussed with Care Management/Social Workerr. Management plans discussed with the patient, family and they are in agreement.  CODE STATUS: full.  TOTAL TIME TAKING CARE OF THIS PATIENT: 35 minutes.   POSSIBLE D/C IN 1-2 DAYS, DEPENDING ON CLINICAL CONDITION.   Altamese DillingVACHHANI, Liberty Stead M.D on 01/15/2016   Between 7am to 6pm - Pager - [phone]  After 6pm go to [email or web address] - password [email or web address]  [facility]  Office  [phone]  CC: Primary care physician; Luna FuseEJAN-SIE, SHEIKH AHMED, MD  Note: This dictation was prepared with Dragon dictation along with smaller phrase technology. Any transcriptional errors that result from this process are unintentional.

## 2016-01-15 NOTE — Progress Notes (Signed)
Pt IV infiltrated and prefers not to have another IV as he "has very little veins and I hate needles". Pt receiving IV fluids due to elevated BUN and Creatine. Per MD will keep IV out for now and re check labs in am. Encouraging good p.o intake.

## 2016-01-16 LAB — BASIC METABOLIC PANEL
ANION GAP: 8 (ref 5–15)
BUN: 30 mg/dL — ABNORMAL HIGH (ref 6–20)
CO2: 22 mmol/L (ref 22–32)
Calcium: 8.4 mg/dL — ABNORMAL LOW (ref 8.9–10.3)
Chloride: 103 mmol/L (ref 101–111)
Creatinine, Ser: 1.48 mg/dL — ABNORMAL HIGH (ref 0.61–1.24)
GFR calc Af Amer: >60 mL/min (ref >60)
GFR calc non Af Amer: 52 mL/min — ABNORMAL LOW (ref >60)
GLUCOSE: 223 mg/dL — AB (ref 65–99)
POTASSIUM: 3.3 mmol/L — AB (ref 3.5–5.1)
Sodium: 133 mmol/L — ABNORMAL LOW (ref 135–145)

## 2016-01-16 LAB — GLUCOSE, CAPILLARY
Glucose-Capillary: 210 mg/dL — ABNORMAL HIGH (ref 65–99)
Glucose-Capillary: 289 mg/dL — ABNORMAL HIGH (ref 65–99)

## 2016-01-16 MED ORDER — ATORVASTATIN CALCIUM 20 MG PO TABS: 20.0000 mg | ORAL_TABLET | Freq: Every day | ORAL | 0 refills | Status: DC

## 2016-01-16 MED ORDER — LABETALOL HCL 100 MG PO TABS: 100.0000 mg | ORAL_TABLET | Freq: Two times a day (BID) | ORAL | 0 refills | Status: DC

## 2016-01-16 MED ORDER — INSULIN GLARGINE 100 UNIT/ML SOLOSTAR PEN: 30.0000 [IU] | PEN_INJECTOR | Freq: Every day | SUBCUTANEOUS | 11 refills | Status: DC

## 2016-01-16 MED ORDER — POTASSIUM CHLORIDE 20 MEQ PO PACK
40.0000 meq | PACK | Freq: Once | ORAL | Status: AC
  Administered 2016-01-16: 40 meq via ORAL
  Filled 2016-01-16: qty 2

## 2016-01-16 MED ORDER — CLONIDINE HCL 0.1 MG PO TABS: 0.1000 mg | ORAL_TABLET | Freq: Two times a day (BID) | ORAL | 0 refills | Status: DC

## 2016-01-16 MED ORDER — AMLODIPINE BESYLATE 5 MG PO TABS: 5.0000 mg | ORAL_TABLET | Freq: Every day | ORAL | 0 refills | Status: DC

## 2016-01-16 NOTE — Progress Notes (Signed)
[facility]  ROUNDING NOTE   Subjective:  Pt feeling better today.   Blood sugar 223 this AM.  Cr down to 1.48 with IVF hydration.  Discussed management with Dr. Elisabeth PigeonVachhani.    Objective:  Vital signs in last 24 hours:  Temp:  [97.5 F (36.4 C)-97.8 F (36.6 C)] 97.8 F (36.6 C) (01/13 0901) Pulse Rate:  [66-75] 75 (01/13 0901) Resp:  [20] 20 (01/13 0507) BP: (109-143)/(56-84) 123/78 (01/13 0901) SpO2:  [96 %-99 %] 99 % (01/13 0901)  Weight change:  Filed Weights   01/13/16 0905 01/13/16 2316  Weight: (!) 149.7 kg (330 lb) (!) 144.7 kg (319 lb 1.6 oz)    Intake/Output: I/O last 3 completed shifts: In: 2521.1 [P.O.:1020; I.V.:1501.1] Out: 2750 [Urine:2750]   Intake/Output this shift:  Total I/O In: 240 [P.O.:240] Out: -   Physical Exam: General: No acute distress  Head: Normocephalic, atraumatic. Moist oral mucosal membranes  Eyes: Anicteric  Neck: Supple, trachea midline  Lungs:  Clear to auscultation, normal effort  Heart: S1S2 no rubs  Abdomen:  Soft, nontender, bowel sounds present   Extremities: 1+ peripheral edema.  Neurologic: Nonfocal, moving all four extremities  Skin: No lesions       Basic Metabolic Panel:  Recent Labs Lab 01/13/16 0014 01/13/16 0917 01/14/16 0415 01/15/16 0418 01/16/16 0531  NA 130* 131* 136 132* 133*  K 4.0 4.6 3.7 3.3* 3.3*  CL 96* 96* 105 100* 103  CO2 22 23 24 24 22   GLUCOSE 522* 749* 310* 294* 223*  BUN 15 18 22* 38* 30*  CREATININE 1.39* 1.51* 2.15* 2.34* 1.48*  CALCIUM 9.3 9.0 8.7* 8.2* 8.4*    Liver Function Tests:  Recent Labs Lab 01/13/16 0917  AST 34  ALT 39  ALKPHOS 110  BILITOT 1.5*  PROT 7.9  ALBUMIN 4.5   No results for input(s): LIPASE, AMYLASE in the last 168 hours. No results for input(s): AMMONIA in the last 168 hours.  CBC:  Recent Labs Lab 01/13/16 0014 01/13/16 1230 01/14/16 0415 01/15/16 0418  WBC 10.2 12.5* 15.4* 10.3  NEUTROABS  --  9.5*  --   --   HGB 18.4*  17.2 15.2 15.0  HCT 53.1* 50.3 44.5 42.2  MCV 82.3 83.6 82.6 81.5  PLT 263 247 262 220    Cardiac Enzymes:  Recent Labs Lab 01/13/16 0917  TROPONINI <0.03    BNP: Invalid input(s): POCBNP  CBG:  Recent Labs Lab 01/15/16 0747 01/15/16 1150 01/15/16 1706 01/15/16 2129 01/16/16 0742  GLUCAP 278* 287* 265* 255* 210*    Microbiology: No results found for this or any previous visit.  Coagulation Studies:  Recent Labs  01/13/16 1257  LABPROT 11.9  INR 0.88    Urinalysis:  Recent Labs  01/13/16 1257  COLORURINE STRAW*  LABSPEC 1.028  PHURINE 7.0  GLUCOSEU >=500*  HGBUR NEGATIVE  BILIRUBINUR NEGATIVE  KETONESUR NEGATIVE  PROTEINUR NEGATIVE  NITRITE NEGATIVE  LEUKOCYTESUR NEGATIVE      Imaging: [place] Renal  Result Date: 01/14/2016 CLINICAL DATA:  Acute renal failure EXAM: RENAL / URINARY TRACT ULTRASOUND COMPLETE COMPARISON:  None. FINDINGS: Right Kidney: Length: 10.3 cm. Echogenicity and renal cortical thickness are within normal limits. No mass, perinephric fluid, or hydronephrosis visualized. No sonographically demonstrable calculus or ureterectasis. Left Kidney: Length: 12.3 cm. Echogenicity is mildly increased. Renal cortical thickness is within normal limits. No mass, perinephric fluid, or hydronephrosis visualized. No sonographically demonstrable calculus or ureterectasis. Bladder: Appears normal for degree of bladder distention.  Liver echogenicity overall is increased. IMPRESSION: Left kidney echogenicity is mildly increased. This finding may be seen with medical renal disease. No obstructing foci identified in either kidney. There is borderline size discrepancy between the kidneys. This finding potentially could indicate a degree of renal artery stenosis on the right. In this regard, question whether patient is hypertensive. Incidental note is made of increased liver echogenicity, a finding most likely indicative of hepatic steatosis. Electronically Signed    By: Bretta Bang III M.D.   On: 01/14/2016 15:58     Medications:    . amLODipine  5 mg Oral Daily  . aspirin EC  81 mg Oral Daily  . atorvastatin  20 mg Oral q1800  . cloNIDine  0.1 mg Oral BID  . enoxaparin (LOVENOX) injection  40 mg Subcutaneous Q24H  . insulin aspart  0-5 Units Subcutaneous QHS  . insulin aspart  0-9 Units Subcutaneous TID WC  . insulin glargine  30 Units Subcutaneous Daily  . labetalol  100 mg Oral BID   acetaminophen **OR** acetaminophen, ondansetron (ZOFRAN) IV, oxyCODONE  Assessment/ Plan:  56 y.o. male with a PMHx of Hypertension, right foot injury, gout, who was admitted to [facility] on 01/13/2016 for evaluation of Headache and uncontrolled hypertension.   1.  Acute renal failure due to new onset diabetes/polyuria and normalization of elevated blood pressure. 2.  Diabetes mellitus type 2 without complication. 3. Hypokalemia. 4. Hypertension.  Plan:   Renal function has improved today.  Pt now off of IVF hydration.  Counseled pt extensively today regarding management of underlying diabetes mellitus.  Continue amlodipine, clonidine, and labetalol for blood pressure control.  Will need to adjust his antiypertensive regimen as an outpt.  He also had mild kidney size discrepancy.  We will need to consider checking renal US as an outpt as well.  Ok for discharge from our perspective.  We plan to see the patient back in the office on 01/25/16.      LOS: 3 Polette Nofsinger 1/13/201810:53 AM

## 2016-01-16 NOTE — Discharge Instructions (Signed)
Check blood sugar 3 times daily and keep a record of that. Take it to you PMD next week. Regular diet and exercise.

## 2016-01-16 NOTE — Progress Notes (Signed)
Patient discharge this shift; discharge instructions as ordered; voiced understanding' discharge via w/c by staff with spouse by side. denis pain at this time; no s/s of hypo/hyperglycemia.

## 2016-01-16 NOTE — Discharge Summary (Signed)
[facility] - [place] [facility]   PATIENT NAME: Francisco Rice    MR#:  [id number]  DATE OF BIRTH:  1960/05/04  DATE OF ADMISSION:  01/13/2016 ADMITTING PHYSICIAN: Alford Highlandichard Wieting, MD  DATE OF DISCHARGE: 01/16/2016  PRIMARY CARE PHYSICIAN: Luna FuseEJAN-SIE, SHEIKH AHMED, MD    ADMISSION DIAGNOSIS:  Hypertensive encephalopathy [I67.4] Hyperglycemia [R73.9] Transient cerebral ischemia, unspecified type [G45.9]  DISCHARGE DIAGNOSIS:  Active Problems:   Hypertensive encephalopathy   Uncontrolled Hypertension   New onset diabetes- uncontrolled.  SECONDARY DIAGNOSIS:   Past Medical History:  Diagnosis Date  . Hypertension     HOSPITAL COURSE:   1. Hypertensive encephalopathy.   Clonidine, amlodipine, labetalol   Now blood pressure is under control.   Patient has headaches resolved now and no numbness.   Blood pressure is on lower normal side, decreased dose of meds as renal func is compromized.   BP and renal func are now stable. Advise diet control and weight reduction.   Follow with PMD and nephrology in 1 week. 2. Confusion and headache.    MRI head is negative now patient is fine. 3. Type 2 diabetes mellitus , new diagnosis.    High hemoglobin A1c. Started on sliding scale and Lantus 20 units daily.    Counseled about diet control and got dietary consult.   Will give only lantus 30 u daily for now, and advised to check blood sugar 3 time daily, and PMD will adjust the dose further. 4. Obesity weight loss needed 5. Dehydration and acute kidney injury and hyponatremia fluid bolus ordered. Hyponatremia secondary to high sugar also.   Acute kidney injury could be secondary to his extremely high blood pressure, we would like to monitor for next 1 or 2 days.   Called nephro consult.   Renal func improved now, follow in office.  DISCHARGE CONDITIONS:   Stable.  CONSULTS OBTAINED:  Treatment Team:  Munsoor Cherylann RatelLateef, MD  DRUG ALLERGIES:  No Known  Allergies  DISCHARGE MEDICATIONS:   Current Discharge Medication List    START taking these medications   Details  atorvastatin (LIPITOR) 20 MG tablet Take 1 tablet (20 mg total) by mouth daily at 6 PM. Qty: 30 tablet, Refills: 0    Insulin Glargine (LANTUS) 100 UNIT/ML Solostar Pen Inject 30 Units into the skin daily before supper. Take one hour before dinner. Qty: 15 mL, Refills: 11    labetalol (NORMODYNE) 100 MG tablet Take 1 tablet (100 mg total) by mouth 2 (two) times daily. Qty: 60 tablet, Refills: 0      CONTINUE these medications which have CHANGED   Details  !! amLODipine (NORVASC) 5 MG tablet Take 1 tablet (5 mg total) by mouth daily. Qty: 30 tablet, Refills: 0    cloNIDine (CATAPRES) 0.1 MG tablet Take 1 tablet (0.1 mg total) by mouth 2 (two) times daily. Qty: 60 tablet, Refills: 0     !! - Potential duplicate medications found. Please discuss with provider.    CONTINUE these medications which have NOT CHANGED   Details  !! amLODipine (NORVASC) 5 MG tablet Take 1 tablet (5 mg total) by mouth daily. Qty: 30 tablet, Refills: 0     !! - Potential duplicate medications found. Please discuss with provider.    STOP taking these medications     Elastic Bandages & Supports (T.E.D. BELOW KNEE/L-REGULAR) MISC          DISCHARGE INSTRUCTIONS:    Follow with PMD and nephrology.  If you experience  worsening of your admission symptoms, develop shortness of breath, life threatening emergency, suicidal or homicidal thoughts you must seek medical attention immediately by calling 911 or calling your MD immediately  if symptoms less severe.  You Must read complete instructions/literature along with all the possible adverse reactions/side effects for all the Medicines you take and that have been prescribed to you. Take any new Medicines after you have completely understood and accept all the possible adverse reactions/side effects.   Please note  You were cared for by a  hospitalist during your hospital stay. If you have any questions about your discharge medications or the care you received while you were in the hospital after you are discharged, you can call the unit and asked to speak with the hospitalist on call if the hospitalist that took care of you is not available. Once you are discharged, your primary care physician will handle any further medical issues. Please note that NO REFILLS for any discharge medications will be authorized once you are discharged, as it is imperative that you return to your primary care physician (or establish a relationship with a primary care physician if you do not have one) for your aftercare needs so that they can reassess your need for medications and monitor your lab values.    Today   CHIEF COMPLAINT:   Chief Complaint  Patient presents with  . Hypertension    HISTORY OF PRESENT ILLNESS:  Francisco Rice  is a 56 y.o. male with a known history of Hypertension. He presents back to the ER again today with headache and high blood pressure. On the snow day last week he came in and he had high blood pressure and numbness and he was started on Norvasc. He came in last night and was in the waiting room for hours and hours and then left. He went to work today and had numbness on his right hand. His wife has been previously giving him herbal blood pressure to. His headache has been going on for over 3 days. He's had some confusion for over 24 hours but did have some confusion last week also. Headache described as 3 out of 10 intensity. In the ER, his blood pressure has been extremely high and his sugar was over 700. Hospitalist services were contacted for further evaluation.    VITAL SIGNS:  Blood pressure 123/78, pulse 75, temperature 97.8 F (36.6 C), temperature source Oral, resp. rate 20, height 6\' 1"  (1.854 m), weight (!) 144.7 kg (319 lb 1.6 oz), SpO2 99 %.  I/O:   Intake/Output Summary (Last 24 hours) at 01/16/16  1022 Last data filed at 01/16/16 0947  Gross per 24 hour  Intake          1448.75 ml  Output             1950 ml  Net          -501.25 ml    PHYSICAL EXAMINATION:   GENERAL:  56 y.o.-year-old obese patient lying in the bed with no acute distress.  EYES: Pupils equal, round, reactive to light and accommodation. No scleral icterus. Extraocular muscles intact.  HEENT: Head atraumatic, normocephalic. Oropharynx and nasopharynx clear.  NECK:  Supple, no jugular venous distention. No thyroid enlargement, no tenderness.  LUNGS: Normal breath sounds bilaterally, no wheezing, rales,rhonchi or crepitation. No use of accessory muscles of respiration.  CARDIOVASCULAR: S1, S2 normal. No murmurs, rubs, or gallops.  ABDOMEN: Soft, nontender, nondistended. Bowel sounds present. No organomegaly or mass.  EXTREMITIES: No pedal edema, cyanosis, or clubbing.  NEUROLOGIC: Cranial nerves II through XII are intact. Muscle strength 5/5 in all extremities. Sensation intact. Gait not checked.  PSYCHIATRIC: The patient is alert and oriented x 3.  SKIN: No obvious rash, lesion, or ulcer.   DATA REVIEW:   CBC  Recent Labs Lab 01/15/16 0418  WBC 10.3  HGB 15.0  HCT 42.2  PLT 220    Chemistries   Recent Labs Lab 01/13/16 0917  01/16/16 0531  NA 131*  < > 133*  K 4.6  < > 3.3*  CL 96*  < > 103  CO2 23  < > 22  GLUCOSE 749*  < > 223*  BUN 18  < > 30*  CREATININE 1.51*  < > 1.48*  CALCIUM 9.0  < > 8.4*  AST 34  --   --   ALT 39  --   --   ALKPHOS 110  --   --   BILITOT 1.5*  --   --   < > = values in this interval not displayed.  Cardiac Enzymes  Recent Labs Lab 01/13/16 0917  TROPONINI <0.03    Microbiology Results  No results found for this or any previous visit.  RADIOLOGY:  US Renal  Result Date: 01/14/2016 CLINICAL DATA:  Acute renal failure EXAM: RENAL / URINARY TRACT ULTRASOUND COMPLETE COMPARISON:  None. FINDINGS: Right Kidney: Length: 10.3 cm. Echogenicity and renal  cortical thickness are within normal limits. No mass, perinephric fluid, or hydronephrosis visualized. No sonographically demonstrable calculus or ureterectasis. Left Kidney: Length: 12.3 cm. Echogenicity is mildly increased. Renal cortical thickness is within normal limits. No mass, perinephric fluid, or hydronephrosis visualized. No sonographically demonstrable calculus or ureterectasis. Bladder: Appears normal for degree of bladder distention. Liver echogenicity overall is increased. IMPRESSION: Left kidney echogenicity is mildly increased. This finding may be seen with medical renal disease. No obstructing foci identified in either kidney. There is borderline size discrepancy between the kidneys. This finding potentially could indicate a degree of renal artery stenosis on the right. In this regard, question whether patient is hypertensive. Incidental note is made of increased liver echogenicity, a finding most likely indicative of hepatic steatosis. Electronically Signed   By: Bretta Bang III M.D.   On: 01/14/2016 15:58    EKG:   Orders placed or performed during the hospital encounter of 01/13/16  . ED EKG  . ED EKG  . EKG 12-Lead  . EKG 12-Lead  . EKG 12-Lead  . EKG 12-Lead  . EKG 12-Lead  . EKG 12-Lead      Management plans discussed with the patient, family and they are in agreement.  CODE STATUS:     Code Status Orders        Start     Ordered   01/13/16 1402  Full code  Continuous     01/13/16 1402    Code Status History    Date Active Date Inactive Code Status Order ID Comments User Context   This patient has a current code status but no historical code status.      TOTAL TIME TAKING CARE OF THIS PATIENT: 35 minutes.    Altamese Dilling M.D on 01/16/2016 at 10:22 AM  Between 7am to 6pm - Pager - [phone]  After 6pm go to [email or web address] - password [email or web address]  [facility]  Office  [phone]  CC: Primary care physician;  Luna Fuse, MD   Note: This dictation was prepared with  Dragon dictation along with smaller Company secretary. Any transcriptional errors that result from this process are unintentional.

## 2016-01-25 LAB — HM DIABETES EYE EXAM

## 2016-02-02 ENCOUNTER — Ambulatory Visit: Payer: Commercial Managed Care - PPO | Admitting: *Deleted

## 2016-02-04 ENCOUNTER — Encounter: Payer: Self-pay | Admitting: *Deleted

## 2016-02-04 ENCOUNTER — Encounter: Payer: Commercial Managed Care - PPO | Attending: Internal Medicine | Admitting: *Deleted

## 2016-02-04 VITALS — BP 144/88 | Ht 72.0 in | Wt 313.9 lb

## 2016-02-04 DIAGNOSIS — Z6841 Body Mass Index (BMI) 40.0 and over, adult: Secondary | ICD-10-CM | POA: Insufficient documentation

## 2016-02-04 DIAGNOSIS — E119 Type 2 diabetes mellitus without complications: Secondary | ICD-10-CM | POA: Diagnosis present

## 2016-02-04 DIAGNOSIS — Z713 Dietary counseling and surveillance: Secondary | ICD-10-CM | POA: Insufficient documentation

## 2016-02-04 NOTE — Patient Instructions (Addendum)
Check blood sugars 1-2 x day before breakfast and/or 2 hrs after supper every day Bring blood sugar records to the next class  Exercise: Continue walking for 30-60 minutes  5 days a week  Eat 3 meals day, 1-2 snacks a day Space meals 4-6 hours apart  Carry fast acting glucose and a snack at all times  Rotate injection sites Hold insulin pen in place for 5-10 seconds after injection  Return for classes on:

## 2016-02-04 NOTE — Progress Notes (Signed)
Diabetes Self-Management Education  Visit Type: First/Initial  Appt. Start Time: 1545 Appt. End Time: 1700  02/04/2016  Francisco Rice, identified by name and date of birth, is a 56 y.o. male with a diagnosis of Diabetes: Type 2.   ASSESSMENT  Blood pressure (!) 144/88, height 6' (1.829 m), weight (!) 313 lb 14.4 oz (142.4 kg). Body mass index is 42.57 kg/m.      Diabetes Self-Management Education - 02/04/16 1715      Visit Information   Visit Type First/Initial     Initial Visit   Diabetes Type Type 2   Are you currently following a meal plan? Yes   What type of meal plan do you follow? "decrease carbs, portion control, grilled chicken, more salads"   Are you taking your medications as prescribed? Yes   Date Diagnosed last month     Health Coping   How would you rate your overall health? Good     Psychosocial Assessment   Patient Belief/Attitude about Diabetes Motivated to manage diabetes   Self-care barriers None   Self-management support Doctor's office;Family   Patient Concerns Nutrition/Meal planning;Medication;Monitoring;Healthy Lifestyle;Glycemic Control;Weight Control;Problem Solving   Special Needs None   Preferred Learning Style Auditory;Visual;Hands on   Learning Readiness Change in progress   How often do you need to have someone help you when you read instructions, pamphlets, or other written materials from your doctor or pharmacy? 1 - Never   What is the last grade level you completed in school? some college     Pre-Education Assessment   Patient understands the diabetes disease and treatment process. Needs Instruction   Patient understands incorporating nutritional management into lifestyle. Needs Instruction   Patient undertands incorporating physical activity into lifestyle. Needs Review   Patient understands using medications safely. Needs Review   Patient understands monitoring blood glucose, interpreting and using results Needs Review   Patient  understands prevention, detection, and treatment of acute complications. Needs Instruction   Patient understands prevention, detection, and treatment of chronic complications. Needs Instruction   Patient understands how to develop strategies to address psychosocial issues. Needs Instruction   Patient understands how to develop strategies to promote health/change behavior. Needs Instruction     Complications   Last HgB A1C per patient/outside source 10 %  01/13/16   How often do you check your blood sugar? 1-2 times/day   Fasting Blood glucose range (mg/dL) [phone]  Pt reports FBG's 106-112 mg/dL   Have you had a dilated eye exam in the past 12 months? Yes   Have you had a dental exam in the past 12 months? Yes   Are you checking your feet? No     Dietary Intake   Breakfast Pt [place] [employer] - English muffin with chicken, egg, lettuce and tomato; egg and oatmeal   Lunch English muffin with grilled chicken and occasionally 2-3 potato wedges   Snack (afternoon) peanut butter crackers   Dinner grilled chicken salad, salmon, boneless ribs, baked potato, fruit   Beverage(s) water, unsweetened tea or lemonade     Exercise   Exercise Type Light (walking / raking leaves)   How many days per week to you exercise? 5   How many minutes per day do you exercise? 45   Total minutes per week of exercise 225     Patient Education   Previous Diabetes Education No   Disease state  Definition of diabetes, type 1 and 2, and the diagnosis of diabetes   Nutrition  management  Role of diet in the treatment of diabetes and the relationship between the three main macronutrients and blood glucose level;Carbohydrate counting;Reviewed blood glucose goals for pre and post meals and how to evaluate the patients' food intake on their blood glucose level.;Information on hints to eating out and maintain blood glucose control.   Physical activity and exercise  Role of exercise on diabetes management, blood  pressure control and cardiac health.   Medications Taught/reviewed insulin injection, site rotation, insulin storage and needle disposal.;Reviewed patients medication for diabetes, action, purpose, timing of dose and side effects.   Monitoring Purpose and frequency of SMBG.;Taught/discussed recording of test results and interpretation of SMBG.;Identified appropriate SMBG and/or A1C goals.   Acute complications Taught treatment of hypoglycemia - the 15 rule.   Chronic complications Relationship between chronic complications and blood glucose control   Psychosocial adjustment Identified and addressed patients feelings and concerns about diabetes     Individualized Goals (developed by patient)   Reducing Risk Improve blood sugars Decrease medications Prevent diabetes complications Lose weight Lead a healthier lifestyle Become more fit     Outcomes   Expected Outcomes Demonstrated interest in learning. Expect positive outcomes      Individualized Plan for Diabetes Self-Management Training:   Learning Objective:  Patient will have a greater understanding of diabetes self-management. Patient education plan is to attend individual and/or group sessions per assessed needs and concerns.   Plan:   Patient Instructions  Check blood sugars 1-2 x day before breakfast and/or 2 hrs after supper every day Bring blood sugar records to the next class Exercise: Continue walking for 30-60 minutes  5 days a week Eat 3 meals day, 1-2 snacks a day Space meals 4-6 hours apart Carry fast acting glucose and a snack at all times Rotate injection sites Hold insulin pen in place for 5-10 seconds after injection   Expected Outcomes:  Demonstrated interest in learning. Expect positive outcomes  Education material provided:  General Meal Planning Guidelines Simple Meal Plan Symptoms, causes and treatments of Hypoglycemia  If problems or questions, patient to contact team via:  Sharion Settler, RN, CCM,  CDE [phone]  Future DSME appointment:  Monday February 08, 2016 for Diabetes Class 1

## 2016-02-08 ENCOUNTER — Encounter: Payer: Commercial Managed Care - PPO | Admitting: Dietician

## 2016-02-08 ENCOUNTER — Encounter: Payer: Self-pay | Admitting: Dietician

## 2016-02-08 VITALS — Ht 72.0 in | Wt 315.4 lb

## 2016-02-08 DIAGNOSIS — E119 Type 2 diabetes mellitus without complications: Secondary | ICD-10-CM | POA: Diagnosis not present

## 2016-02-08 NOTE — Progress Notes (Signed)

## 2016-02-15 ENCOUNTER — Encounter: Payer: Self-pay | Admitting: Dietician

## 2016-02-15 ENCOUNTER — Encounter: Payer: Commercial Managed Care - PPO | Admitting: Dietician

## 2016-02-15 VITALS — Wt 318.2 lb

## 2016-02-15 DIAGNOSIS — E119 Type 2 diabetes mellitus without complications: Secondary | ICD-10-CM

## 2016-02-15 NOTE — Patient Instructions (Signed)

## 2016-03-11 ENCOUNTER — Encounter (INDEPENDENT_AMBULATORY_CARE_PROVIDER_SITE_OTHER): Payer: Commercial Managed Care - PPO

## 2016-03-28 ENCOUNTER — Encounter: Payer: Commercial Managed Care - PPO | Attending: Internal Medicine | Admitting: Dietician

## 2016-03-28 ENCOUNTER — Encounter: Payer: Self-pay | Admitting: Dietician

## 2016-03-28 ENCOUNTER — Telehealth: Payer: Self-pay | Admitting: Dietician

## 2016-03-28 VITALS — BP 170/110 | Ht 72.0 in | Wt 302.7 lb

## 2016-03-28 DIAGNOSIS — Z6841 Body Mass Index (BMI) 40.0 and over, adult: Secondary | ICD-10-CM | POA: Diagnosis not present

## 2016-03-28 DIAGNOSIS — E119 Type 2 diabetes mellitus without complications: Secondary | ICD-10-CM | POA: Diagnosis present

## 2016-03-28 DIAGNOSIS — Z713 Dietary counseling and surveillance: Secondary | ICD-10-CM | POA: Diagnosis not present

## 2016-03-28 NOTE — Progress Notes (Signed)

## 2016-03-28 NOTE — Telephone Encounter (Signed)
Called patient to follow up about elevated BP of 170/110 this evening (5:30pm, and retest at 8:30pm with same results). Instructed patient to follow up with his MD tomorrow morning. Advised him to test his BP at home as well.

## 2016-04-15 ENCOUNTER — Encounter: Payer: Self-pay | Admitting: *Deleted

## 2016-04-21 ENCOUNTER — Ambulatory Visit: Payer: Commercial Managed Care - PPO | Admitting: Internal Medicine

## 2016-04-22 ENCOUNTER — Ambulatory Visit (INDEPENDENT_AMBULATORY_CARE_PROVIDER_SITE_OTHER): Payer: Commercial Managed Care - PPO | Admitting: Internal Medicine

## 2016-04-22 ENCOUNTER — Encounter: Payer: Self-pay | Admitting: Internal Medicine

## 2016-04-22 DIAGNOSIS — G4730 Sleep apnea, unspecified: Secondary | ICD-10-CM

## 2016-04-22 DIAGNOSIS — I10 Essential (primary) hypertension: Secondary | ICD-10-CM | POA: Diagnosis not present

## 2016-04-22 DIAGNOSIS — I674 Hypertensive encephalopathy: Secondary | ICD-10-CM

## 2016-04-22 DIAGNOSIS — E118 Type 2 diabetes mellitus with unspecified complications: Secondary | ICD-10-CM | POA: Diagnosis not present

## 2016-04-22 NOTE — Progress Notes (Signed)
Pre-visit discussion using our clinic review tool. No additional management support is needed unless otherwise documented below in the visit note.  

## 2016-04-22 NOTE — Progress Notes (Signed)
Patient ID: Francisco Rice, male   DOB: 10/26/1960, 56 y.o.   MRN: [id number]   Subjective:    Patient ID: Francisco Rice, male    DOB: 1960-10-03, 56 y.o.   MRN: [id number]  HPI  Patient here to establish care.  He was admitted 01/13/16 with hypertensive encephalopathy and uncontrolled hypertension.  Prescribed clonidine, amlodipine and labetalol.  Blood pressure prior to discharge was under control.  States he has taken other blood pressure medications and had intolerance to these medications.  Was to f/u with nephrology.  Had some confusion and headache on presentation.  This has resolved.  He was also diagnosed with diabetes.  Has been seeing a nutritionist.  Working on his exercise.  Has adjusted his diet.  Trying to get adjusted to CPAP.  In the process of adjusting his settings.  Has not had a good night sleep since started trying a few weeks ago.  Blood pressure varies.  States averaging 481-856 systolic readings.  Had eyes checked 4 months ago.   Had colonoscopy 2 years ago.  Recommended f/u in 7-10 years.     Past Medical History:  Diagnosis Date  . Diabetes mellitus without complication ([place])   . Gout   . Hypertension   . Sleep apnea    Past Surgical History:  Procedure Laterality Date  . APPENDECTOMY     Family History  Problem Relation Age of Onset  . Healthy Mother   . Hypertension Mother   . Parkinson's disease Father    Social History   Social History  . Marital status: Married    Spouse name: N/A  . Number of children: N/A  . Years of education: N/A   Social History Main Topics  . Smoking status: Never Smoker  . Smokeless tobacco: Never Used  . Alcohol use No  . Drug use: No  . Sexual activity: Not Asked   Other Topics Concern  . None   Social History Narrative  . None    Outpatient Encounter Prescriptions as of 04/22/2016  Medication Sig  . cloNIDine (CATAPRES) 0.2 MG tablet Take 1 tablet by mouth 2 (two) times daily.  Marland Kitchen labetalol (NORMODYNE) 100 MG  tablet Take 1 tablet (100 mg total) by mouth 2 (two) times daily.  Marland Kitchen NAPROXEN PO Take by mouth.  . [DISCONTINUED] atorvastatin (LIPITOR) 20 MG tablet Take 1 tablet (20 mg total) by mouth daily at 6 PM. (Patient not taking: Reported on 03/28/2016)  . [DISCONTINUED] Linagliptin (TRADJENTA PO) Take 5 mg by mouth daily.   No facility-administered encounter medications on file as of 04/22/2016.     Review of Systems  Constitutional: Positive for fatigue. Negative for appetite change and unexpected weight change.  HENT: Negative for congestion and sinus pressure.   Eyes: Negative for pain and visual disturbance.  Respiratory: Negative for cough, chest tightness and shortness of breath.   Cardiovascular: Negative for chest pain, palpitations and leg swelling.  Gastrointestinal: Negative for abdominal pain, diarrhea, nausea and vomiting.  Genitourinary: Negative for difficulty urinating, dysuria and frequency.  Musculoskeletal: Negative for back pain and joint swelling.  Skin: Negative for color change and rash.  Neurological: Negative for dizziness, light-headedness and headaches.  Hematological: Negative for adenopathy. Does not bruise/bleed easily.  Psychiatric/Behavioral: Negative for agitation and dysphoric mood.       Objective:    Physical Exam  Constitutional: He appears well-developed and well-nourished. No distress.  Cardiovascular: Normal rate and regular rhythm.   Pulmonary/Chest: Effort normal and breath sounds normal.  No respiratory distress.  Abdominal: Soft. Bowel sounds are normal. There is no tenderness.  Musculoskeletal: He exhibits no edema.  Psychiatric: He has a normal mood and affect. His behavior is normal.    BP (!) 158/88 (BP Location: Right Arm, Patient Position: Sitting, Cuff Size: Large)   Pulse 70   Temp 98.4 F (36.9 C) (Oral)   Resp 12   Ht 6' 0.44" (1.84 m)   Wt (!) 302 lb (137 kg)   SpO2 96%   BMI 40.46 kg/m  Wt Readings from Last 3 Encounters:    04/22/16 (!) 302 lb (137 kg)  03/28/16 (!) 302 lb 11.2 oz (137.3 kg)  02/15/16 (!) 318 lb 3.2 oz (144.3 kg)     Lab Results  Component Value Date   WBC 10.3 01/15/2016   HGB 15.0 01/15/2016   HCT 42.2 01/15/2016   PLT 220 01/15/2016   GLUCOSE 223 (H) 01/16/2016   CHOL 200 01/14/2016   TRIG 138 01/14/2016   HDL 39 (L) 01/14/2016   LDLCALC 133 (H) 01/14/2016   ALT 39 01/13/2016   AST 34 01/13/2016   NA 133 (L) 01/16/2016   K 3.3 (L) 01/16/2016   CL 103 01/16/2016   CREATININE 1.48 (H) 01/16/2016   BUN 30 (H) 01/16/2016   CO2 22 01/16/2016   INR 0.88 01/13/2016   HGBA1C 10.0 (H) 01/13/2016    Ct Head Wo Contrast  Result Date: 01/13/2016 CLINICAL DATA:  High blood pressure. Visual disturbance since yesterday. EXAM: CT HEAD WITHOUT CONTRAST TECHNIQUE: Contiguous axial images were obtained from the base of the skull through the vertex without intravenous contrast. COMPARISON:  01/06/2016 FINDINGS: Brain: Mild volume loss. No acute intracranial abnormality. Specifically, no hemorrhage, hydrocephalus, mass lesion, acute infarction, or significant intracranial injury. Vascular: No hyperdense vessel or unexpected calcification. Skull: No acute calvarial abnormality. Sinuses/Orbits: Visualized paranasal sinuses and mastoids clear. Orbital soft tissues unremarkable. Other: None IMPRESSION: No acute intracranial abnormality. Electronically Signed   By: Rolm Baptise M.D.   On: 01/13/2016 09:36   Mr Brain Wo Contrast  Result Date: 01/13/2016 CLINICAL DATA:  Hypertensive encephalopathy. Headaches for 2-3 weeks. EXAM: MRI HEAD WITHOUT CONTRAST TECHNIQUE: Multiplanar, multiecho pulse sequences of the brain and surrounding structures were obtained without intravenous contrast. COMPARISON:  Head CT 01/13/2016 FINDINGS: The study is mildly motion degraded. Brain: There is no evidence of acute infarct, intracranial hemorrhage, mass, midline shift, or extra-axial fluid collection. A cavum septum  pellucidum et vergae is again incidentally noted. There is mild cerebral atrophy. Periventricular white matter T2 hyperintensities are nonspecific but compatible with minimal chronic small vessel ischemic disease. No signal abnormalities suggestive of PRES are identified. Vascular: Major intracranial vascular flow voids are preserved, with the left vertebral artery being dominant. Skull and upper cervical spine: No focal marrow lesion. Sinuses/Orbits: Unremarkable orbits. Mild paranasal sinus mucosal thickening, greatest in the left maxillary sinus. Clear mastoid air cells. Other: None. IMPRESSION: 1. No acute intracranial abnormality. 2. Mild cerebral atrophy and minimal chronic small vessel ischemic disease. Electronically Signed   By: Logan Bores M.D.   On: 01/13/2016 16:45   US Renal  Result Date: 01/14/2016 CLINICAL DATA:  Acute renal failure EXAM: RENAL / URINARY TRACT ULTRASOUND COMPLETE COMPARISON:  None. FINDINGS: Right Kidney: Length: 10.3 cm. Echogenicity and renal cortical thickness are within normal limits. No mass, perinephric fluid, or hydronephrosis visualized. No sonographically demonstrable calculus or ureterectasis. Left Kidney: Length: 12.3 cm. Echogenicity is mildly increased. Renal cortical thickness is within normal limits.  No mass, perinephric fluid, or hydronephrosis visualized. No sonographically demonstrable calculus or ureterectasis. Bladder: Appears normal for degree of bladder distention. Liver echogenicity overall is increased. IMPRESSION: Left kidney echogenicity is mildly increased. This finding may be seen with medical renal disease. No obstructing foci identified in either kidney. There is borderline size discrepancy between the kidneys. This finding potentially could indicate a degree of renal artery stenosis on the right. In this regard, question whether patient is hypertensive. Incidental note is made of increased liver echogenicity, a finding most likely indicative of  hepatic steatosis. Electronically Signed   By: Lowella Grip III M.D.   On: 01/14/2016 15:58       Assessment & Plan:   Problem List Items Addressed This Visit    Diabetes mellitus ([place])    Sugars per his report have improved.  Has adjusted his diet.  On no medication now.  Follow met b and a1c.        Essential hypertension    Blood pressure as outlined.  Follow pressures.  Same medication regimen.  Follow metabolic panel.        Hypertensive encephalopathy    Admitted in 01/2016 with hypertensive encephalopathy.  Doing well now.  Blood pressure is better.  Obtain outside records to know what blood pressure medications he has been tried on.  Had intolerance to amlodipine.  Currently on labetalol and clonidine.  Follow metabolic panel.        Sleep apnea    Recently diagnosed.  Trying to get used to CPAP.  They are in the process of adjusting his medications.  Follow.          I spent 45 minutes with the patient and more than 50% of the time was spent in consultation regarding the above.  Time spent obtaining information regarding his past medical history and recent hospitalization.  Also discussing his current care and treatment recommendations.  Also discussed treatment plan and f/u.     Einar Pheasant, MD

## 2016-04-24 ENCOUNTER — Encounter: Payer: Self-pay | Admitting: Internal Medicine

## 2016-04-24 DIAGNOSIS — I10 Essential (primary) hypertension: Secondary | ICD-10-CM | POA: Insufficient documentation

## 2016-04-24 DIAGNOSIS — G4730 Sleep apnea, unspecified: Secondary | ICD-10-CM | POA: Insufficient documentation

## 2016-04-24 DIAGNOSIS — E119 Type 2 diabetes mellitus without complications: Secondary | ICD-10-CM | POA: Insufficient documentation

## 2016-04-24 NOTE — Assessment & Plan Note (Signed)
Sugars per his report have improved.  Has adjusted his diet.  On no medication now.  Follow met b and a1c.

## 2016-04-24 NOTE — Assessment & Plan Note (Signed)
Recently diagnosed.  Trying to get used to CPAP.  They are in the process of adjusting his medications.  Follow.

## 2016-04-24 NOTE — Assessment & Plan Note (Signed)
Blood pressure as outlined.  Follow pressures.  Same medication regimen.  Follow metabolic panel.  

## 2016-04-24 NOTE — Assessment & Plan Note (Signed)
Admitted in 01/2016 with hypertensive encephalopathy.  Doing well now.  Blood pressure is better.  Obtain outside records to know what blood pressure medications he has been tried on.  Had intolerance to amlodipine.  Currently on labetalol and clonidine.  Follow metabolic panel.

## 2016-05-09 ENCOUNTER — Other Ambulatory Visit (INDEPENDENT_AMBULATORY_CARE_PROVIDER_SITE_OTHER): Payer: Self-pay | Admitting: Nephrology

## 2016-05-09 ENCOUNTER — Other Ambulatory Visit (INDEPENDENT_AMBULATORY_CARE_PROVIDER_SITE_OTHER): Payer: Commercial Managed Care - PPO

## 2016-05-09 DIAGNOSIS — I10 Essential (primary) hypertension: Secondary | ICD-10-CM

## 2016-05-09 DIAGNOSIS — N179 Acute kidney failure, unspecified: Secondary | ICD-10-CM | POA: Diagnosis not present

## 2016-09-26 ENCOUNTER — Telehealth: Payer: Self-pay | Admitting: Internal Medicine

## 2016-09-26 NOTE — Telephone Encounter (Signed)
Called patient to follow up on [facility] quality Metrics and to schedule patient FU appointment with PCP , patient advised nurse he was returning to his old provider DR. Leane Para? FYI

## 2016-10-23 ENCOUNTER — Emergency Department
Admission: EM | Admit: 2016-10-23 | Discharge: 2016-10-23 | Disposition: A | Discharge status: Home / Self Care | Payer: Commercial Managed Care - PPO | Attending: Emergency Medicine | Admitting: Emergency Medicine

## 2016-10-23 ENCOUNTER — Encounter: Payer: Self-pay | Admitting: Emergency Medicine

## 2016-10-23 DIAGNOSIS — I10 Essential (primary) hypertension: Secondary | ICD-10-CM | POA: Insufficient documentation

## 2016-10-23 DIAGNOSIS — N189 Chronic kidney disease, unspecified: Secondary | ICD-10-CM

## 2016-10-23 DIAGNOSIS — E119 Type 2 diabetes mellitus without complications: Secondary | ICD-10-CM | POA: Insufficient documentation

## 2016-10-23 DIAGNOSIS — N289 Disorder of kidney and ureter, unspecified: Secondary | ICD-10-CM | POA: Insufficient documentation

## 2016-10-23 DIAGNOSIS — Z79899 Other long term (current) drug therapy: Secondary | ICD-10-CM | POA: Insufficient documentation

## 2016-10-23 LAB — TROPONIN I: Troponin I: <0.03 ng/mL (ref <0.03)

## 2016-10-23 LAB — BASIC METABOLIC PANEL
Anion gap: 10 (ref 5–15)
BUN: 18 mg/dL (ref 6–20)
CHLORIDE: 105 mmol/L (ref 101–111)
CO2: 21 mmol/L — AB (ref 22–32)
Calcium: 8.9 mg/dL (ref 8.9–10.3)
Creatinine, Ser: 1.71 mg/dL — ABNORMAL HIGH (ref 0.61–1.24)
GFR calc Af Amer: 51 mL/min — ABNORMAL LOW (ref >60)
GFR calc non Af Amer: 44 mL/min — ABNORMAL LOW (ref >60)
GLUCOSE: 128 mg/dL — AB (ref 65–99)
POTASSIUM: 3.9 mmol/L (ref 3.5–5.1)
Sodium: 136 mmol/L (ref 135–145)

## 2016-10-23 LAB — CBC
HEMATOCRIT: 46.5 % (ref 40.0–52.0)
Hemoglobin: 16.1 g/dL (ref 13.0–18.0)
MCH: 29 pg (ref 26.0–34.0)
MCHC: 34.6 g/dL (ref 32.0–36.0)
MCV: 83.9 fL (ref 80.0–100.0)
Platelets: 213 10*3/uL (ref 150–440)
RBC: 5.53 MIL/uL (ref 4.40–5.90)
RDW: 14.6 % — AB (ref 11.5–14.5)
WBC: 8.2 10*3/uL (ref 3.8–10.6)

## 2016-10-23 NOTE — Discharge Instructions (Signed)

## 2016-10-23 NOTE — ED Triage Notes (Signed)
Patient states that he checked his blood pressure tonight before taking his blood pressure medications and his blood pressure was 146/79. Patient states that he rechecked his blood pressure an hour after taking the medications and his blood pressure was 176/90. Patient states that his PCP recently increased the dose of his blood pressure medications. Patient denies any chest pain.

## 2016-10-23 NOTE — ED Provider Notes (Signed)
[facility] Emergency Department Provider Note  ____________________________________________   First MD Initiated Contact with Patient 10/23/16 0240     (approximate)  I have reviewed the triage vital signs and the nursing notes.   HISTORY  Chief Complaint Hypertension    HPI Francisco Rice is a 56 y.o. male with medical issues as listed below who presents for evaluation of hypertension.  He reports that he is very conscious of his blood pressure and worried about maintaining careful control on it because his primary care doctor, Dr. Ellsworth Lennox, has told him that his kidneys do not function completely normally and he needs to monitor his blood pressure carefully.  He is on a couple of different medications which he believes are labetalol and clonidine (verified in his medical history).  He reports that his blood pressure was essentially normal tonight in the 140s and then he took his blood pressure medicine.  He decided to check it again about an hour later and it was significantly elevated from baseline.  He checked it again after that and it still was elevated so he decided he should be checked out.  He has been having some intermittent aching pain that is mild and located in his lumbar region of his back over the last few days, but otherwise he has been asymptomatic.  He denies visual changes, headache, chest pain, shortness of breath, nausea, vomiting, abdominal pain, urinary symptoms/edges, and fever/chills.  Describes the elevation of his blood pressure is at least moderate if not severe.  Past Medical History:  Diagnosis Date  . Diabetes mellitus without complication ([facility])   . Gout   . Hypertension   . Sleep apnea     Patient Active Problem List   Diagnosis Date Noted  . Diabetes mellitus ([facility]) 04/24/2016  . Sleep apnea 04/24/2016  . Essential hypertension 04/24/2016  . Hypertensive encephalopathy 01/13/2016    Past Surgical History:  Procedure  Laterality Date  . APPENDECTOMY      Prior to Admission medications   Medication Sig Start Date End Date Taking? Authorizing Provider  cloNIDine (CATAPRES) 0.2 MG tablet Take 1 tablet by mouth 2 (two) times daily.    [provider]  labetalol (NORMODYNE) 100 MG tablet Take 1 tablet (100 mg total) by mouth 2 (two) times daily. 01/16/16   Altamese Dilling, MD  NAPROXEN PO Take by mouth.    [provider]    Allergies Amlodipine; Other; and Tradjenta [linagliptin]  Family History  Problem Relation Age of Onset  . Healthy Mother   . Hypertension Mother   . Parkinson's disease Father     Social History Social History  Substance Use Topics  . Smoking status: Never Smoker  . Smokeless tobacco: Never Used  . Alcohol use No    Review of Systems Constitutional: No fever/chills Eyes: No visual changes. Cardiovascular: Denies chest pain. Respiratory: Denies shortness of breath. Gastrointestinal: No abdominal pain.  No nausea, no vomiting.  No diarrhea.  No constipation. Genitourinary: Negative for dysuria. Musculoskeletal: Negative for neck pain.  Negative for back pain. Integumentary: Negative for rash. Neurological: Negative for headaches, focal weakness or numbness.   ____________________________________________   PHYSICAL EXAM:  VITAL SIGNS: ED Triage Vitals [10/23/16 0040]  Enc Vitals Group     BP (!) 182/95     Pulse Rate 69     Resp 18     Temp 97.8 F (36.6 C)     Temp Source Oral     SpO2 97 %  Weight 131.5 kg (290 lb)     Height 1.829 m (6')     Head Circumference      Peak Flow      Pain Score      Pain Loc      Pain Edu?      Excl. in GC?     Constitutional: Alert and oriented. Well appearing and in no acute distress. Eyes: Conjunctivae are normal.  Head: Atraumatic. Cardiovascular: Normal rate, regular rhythm. Good peripheral circulation. Grossly normal heart sounds. Respiratory: Normal respiratory effort.  No  retractions. Lungs CTAB. Gastrointestinal: Soft and nontender. No distention.  Musculoskeletal: No lower extremity tenderness nor edema. No gross deformities of extremities.  No tenderness to palpation of the lumbar spine nor of the soft tissues/muscles of the back.  Ambulatory without difficulty Neurologic:  Normal speech and language. No gross focal neurologic deficits are appreciated.  Skin:  Skin is warm, dry and intact. No rash noted. Psychiatric: Mood and affect are normal. Speech and behavior are normal.  ____________________________________________   LABS (all labs ordered are listed, but only abnormal results are displayed)  Labs Reviewed  BASIC METABOLIC PANEL - Abnormal; Notable for the following:       Result Value   CO2 21 (*)    Glucose, Bld 128 (*)    Creatinine, Ser 1.71 (*)    GFR calc non Af Amer 44 (*)    GFR calc Af Amer 51 (*)    All other components within normal limits  CBC - Abnormal; Notable for the following:    RDW 14.6 (*)    All other components within normal limits  TROPONIN I   ____________________________________________  EKG  ED ECG REPORT I, Ferne Ellingwood, the attending physician, personally viewed and interpreted this ECG.  Date: 10/23/2016 EKG Time: 00: 40 Rate: 68 Rhythm: normal sinus rhythm QRS Axis: normal Intervals: normal ST/T Wave abnormalities: normal Narrative Interpretation: no evidence of acute ischemia  ____________________________________________  RADIOLOGY   No results found.  ____________________________________________   PROCEDURES  Critical Care performed: No   Procedure(s) performed:   Procedures   ____________________________________________   INITIAL IMPRESSION / ASSESSMENT AND PLAN / ED COURSE  As part of my medical decision making, I reviewed the following data within the electronic MEDICAL RECORD NUMBER Nursing notes reviewed and incorporated, Labs reviewed  and Old chart reviewed    I reviewed  the patient's labs and they are most notable for creatinine of 1.7.  However when I look back through his lab results he has been significantly higher than this in the past as well as being slightly lower.  This appears to be more or less his baseline with some chronic renal insufficiency.  There are no other significant abnormalities in his EKG is he is asymptomatic in spite of the fact that his blood pressure does remain elevated.  Differential diagnosis includes poorly controlled hypertension, anxiety/pain, worsening renal disease, heart disease, etc.  However I think that anxiety and worry over his blood pressure in general is likely driving up a pressure which was previously well controlled.  There is no evidence of hypertensive urgency/emergency tonight.  I had my usual and customary asymptomatic hypertension talk with the patient and encouraged him to follow-up with his PCP but explained why I did not feel it was a good idea for [place] to treat his blood pressure tonight.  He understands and agrees with the plan.  I gave my usual customary return precautions  ____________________________________________  FINAL CLINICAL IMPRESSION(S) / ED DIAGNOSES  Final diagnoses:  Essential hypertension  Chronic renal impairment, unspecified CKD stage     MEDICATIONS GIVEN DURING THIS VISIT:  Medications - No data to display   NEW OUTPATIENT MEDICATIONS STARTED DURING THIS VISIT:  Discharge Medication List as of 10/23/2016  2:55 AM      Discharge Medication List as of 10/23/2016  2:55 AM      Discharge Medication List as of 10/23/2016  2:55 AM       Note:  This document was prepared using Dragon voice recognition software and may include unintentional dictation errors.    Loleta RoseForbach, Arieana Somoza, MD 10/23/16 [phone]

## 2016-12-13 ENCOUNTER — Emergency Department: Payer: Self-pay

## 2016-12-13 ENCOUNTER — Emergency Department
Admission: EM | Admit: 2016-12-13 | Discharge: 2016-12-13 | Disposition: A | Discharge status: Home / Self Care | Payer: Self-pay | Attending: Emergency Medicine | Admitting: Emergency Medicine

## 2016-12-13 ENCOUNTER — Encounter: Payer: Self-pay | Admitting: Emergency Medicine

## 2016-12-13 DIAGNOSIS — I129 Hypertensive chronic kidney disease with stage 1 through stage 4 chronic kidney disease, or unspecified chronic kidney disease: Secondary | ICD-10-CM | POA: Insufficient documentation

## 2016-12-13 DIAGNOSIS — E1122 Type 2 diabetes mellitus with diabetic chronic kidney disease: Secondary | ICD-10-CM | POA: Insufficient documentation

## 2016-12-13 DIAGNOSIS — M545 Low back pain: Secondary | ICD-10-CM | POA: Insufficient documentation

## 2016-12-13 DIAGNOSIS — G8929 Other chronic pain: Secondary | ICD-10-CM | POA: Insufficient documentation

## 2016-12-13 DIAGNOSIS — N189 Chronic kidney disease, unspecified: Secondary | ICD-10-CM | POA: Insufficient documentation

## 2016-12-13 DIAGNOSIS — R1084 Generalized abdominal pain: Secondary | ICD-10-CM | POA: Insufficient documentation

## 2016-12-13 DIAGNOSIS — Z79899 Other long term (current) drug therapy: Secondary | ICD-10-CM | POA: Insufficient documentation

## 2016-12-13 LAB — BASIC METABOLIC PANEL
Anion gap: 9 (ref 5–15)
BUN: 12 mg/dL (ref 6–20)
CHLORIDE: 105 mmol/L (ref 101–111)
CO2: 23 mmol/L (ref 22–32)
CREATININE: 1.61 mg/dL — AB (ref 0.61–1.24)
Calcium: 8.9 mg/dL (ref 8.9–10.3)
GFR calc Af Amer: 54 mL/min — ABNORMAL LOW (ref >60)
GFR, EST NON AFRICAN AMERICAN: 47 mL/min — AB (ref >60)
Glucose, Bld: 98 mg/dL (ref 65–99)
Potassium: 4.3 mmol/L (ref 3.5–5.1)
SODIUM: 137 mmol/L (ref 135–145)

## 2016-12-13 LAB — CBC WITH DIFFERENTIAL/PLATELET
Basophils Absolute: 0 10*3/uL (ref 0–0.1)
Basophils Relative: 0 %
EOS ABS: 0.1 10*3/uL (ref 0–0.7)
EOS PCT: 1 %
HCT: 46.8 % (ref 40.0–52.0)
Hemoglobin: 15.9 g/dL (ref 13.0–18.0)
LYMPHS ABS: 2 10*3/uL (ref 1.0–3.6)
Lymphocytes Relative: 27 %
MCH: 28.7 pg (ref 26.0–34.0)
MCHC: 34.1 g/dL (ref 32.0–36.0)
MCV: 84.1 fL (ref 80.0–100.0)
MONO ABS: 0.5 10*3/uL (ref 0.2–1.0)
Monocytes Relative: 6 %
Neutro Abs: 4.9 10*3/uL (ref 1.4–6.5)
Neutrophils Relative %: 66 %
PLATELETS: 192 10*3/uL (ref 150–440)
RBC: 5.56 MIL/uL (ref 4.40–5.90)
RDW: 13.7 % (ref 11.5–14.5)
WBC: 7.5 10*3/uL (ref 3.8–10.6)

## 2016-12-13 LAB — GLUCOSE, CAPILLARY: GLUCOSE-CAPILLARY: 111 mg/dL — AB (ref 65–99)

## 2016-12-13 NOTE — ED Notes (Signed)
CBG complete. Result 111.

## 2016-12-13 NOTE — ED Triage Notes (Signed)
Patient presents to ED via POV from home with c/o back pain. Patient states "where my kidneys are" x 1 month. Patient states, "I was seen here a month ago for the same and nothing was wrong but I'm still hurting". Patient denies N/V/D.

## 2016-12-13 NOTE — ED Provider Notes (Signed)
[facility] Emergency Department Provider Note  ____________________________________________   I have reviewed the triage vital signs and the nursing notes. Where available I have reviewed prior notes and, if possible and indicated, outside hospital notes.    HISTORY  Chief Complaint Flank Pain    HPI Francisco Rice is a 56 y.o. male with a history of chronic kidney disease, hypertension, obesity, chronic low back pain, diabetes mellitus, OSA, who states he has been having low to mid back pain for the last 2-3 months.  Nonradiating, no flank pain, it is really paraspinal.  Mid back.  Patient ambulates a great deal for his job.  Is not exertional.  It is positional however.  It does not radiate down his legs no incontinence of bowel or bladder no fall, no trauma no abdominal pain, no numbness no weakness.  Patient has had no fever no chills no dysuria no urinary frequency, he denies any abdominal pain.  It is worse when he is on his feet for too long.  Otherwise nothing associated with it.  Has not taken anything for this pain.    Past Medical History:  Diagnosis Date  . Diabetes mellitus without complication (HCC)   . Gout   . Hypertension   . Sleep apnea     Patient Active Problem List   Diagnosis Date Noted  . Diabetes mellitus (HCC) 04/24/2016  . Sleep apnea 04/24/2016  . Essential hypertension 04/24/2016  . Hypertensive encephalopathy 01/13/2016    Past Surgical History:  Procedure Laterality Date  . APPENDECTOMY      Prior to Admission medications   Medication Sig Start Date End Date Taking? Authorizing Provider  cloNIDine (CATAPRES) 0.2 MG tablet Take 1 tablet by mouth 2 (two) times daily.    [provider]  labetalol (NORMODYNE) 100 MG tablet Take 1 tablet (100 mg total) by mouth 2 (two) times daily. 01/16/16   Altamese DillingVachhani, Vaibhavkumar, MD  NAPROXEN PO Take by mouth.    [provider]    Allergies Amlodipine; Other; and  Tradjenta [linagliptin]  Family History  Problem Relation Age of Onset  . Healthy Mother   . Hypertension Mother   . Parkinson's disease Father     Social History Social History   Tobacco Use  . Smoking status: Never Smoker  . Smokeless tobacco: Never Used  Substance Use Topics  . Alcohol use: No  . Drug use: No    Review of Systems Constitutional: No fever/chills Eyes: No visual changes. ENT: No sore throat. No stiff neck no neck pain Cardiovascular: Denies chest pain. Respiratory: Denies shortness of breath. Gastrointestinal:   no vomiting.  No diarrhea.  No constipation. Genitourinary: Negative for dysuria. Musculoskeletal: Negative lower extremity swelling Skin: Negative for rash. Neurological: Negative for severe headaches, focal weakness or numbness.   ____________________________________________   PHYSICAL EXAM:  VITAL SIGNS: ED Triage Vitals  Enc Vitals Group     BP 12/13/16 1157 (!) 165/95     Pulse Rate 12/13/16 1157 76     Resp 12/13/16 1157 16     Temp 12/13/16 1157 98 F (36.7 C)     Temp Source 12/13/16 1157 Oral     SpO2 12/13/16 1157 96 %     Weight 12/13/16 1157 280 lb (127 kg)     Height 12/13/16 1157 6' (1.829 m)     Head Circumference --      Peak Flow --      Pain Score 12/13/16 1409 5  Pain Loc --      Pain Edu? --      Excl. in GC? --     Constitutional: Alert and oriented. Well appearing and in no acute distress. Eyes: Conjunctivae are normal Head: Atraumatic HEENT: No congestion/rhinnorhea. Mucous membranes are moist.  Oropharynx non-erythematous Neck:   Nontender with no meningismus, no masses, no stridor Cardiovascular: Normal rate, regular rhythm. Grossly normal heart sounds.  Good peripheral circulation. Respiratory: Normal respiratory effort.  No retractions. Lungs CTAB. Abdominal: Soft and nontender. No distention. No guarding no rebound Back: There is some reproducible paraspinal muscle tenderness in the upper lumbar  region bilaterally which reproduces his pain, there is no midline tenderness there are no lesions noted. there is no CVA tenderness Musculoskeletal: No lower extremity tenderness, no upper extremity tenderness. No joint effusions, no DVT signs strong distal pulses no edema Neurologic:  Normal speech and language. No gross focal neurologic deficits are appreciated.  Normal reflexes distally, no saddle anesthesia Skin:  Skin is warm, dry and intact. No rash noted. Psychiatric: Mood and affect are normal. Speech and behavior are normal.  ____________________________________________   LABS (all labs ordered are listed, but only abnormal results are displayed)  Labs Reviewed  GLUCOSE, CAPILLARY - Abnormal; Notable for the following components:      Result Value   Glucose-Capillary 111 (*)    All other components within normal limits  URINALYSIS, COMPLETE (UACMP) WITH MICROSCOPIC  BASIC METABOLIC PANEL  CBC WITH DIFFERENTIAL/PLATELET    Pertinent labs  results that were available during my care of the patient were reviewed by me and considered in my medical decision making (see chart for details). ____________________________________________  EKG  I personally interpreted any EKGs ordered by me or triage  ____________________________________________  RADIOLOGY  Pertinent labs & imaging results that were available during my care of the patient were reviewed by me and considered in my medical decision making (see chart for details). If possible, patient and/or family made aware of any abnormal findings.  No results found. ____________________________________________    PROCEDURES  Procedure(s) performed: None  Procedures  Critical Care performed: None  ____________________________________________   INITIAL IMPRESSION / ASSESSMENT AND PLAN / ED COURSE  Pertinent labs & imaging results that were available during my care of the patient were reviewed by me and considered in my  medical decision making (see chart for details).  Patient here with reproducible low back pain he is worried that it could be "something".  Most likely this is a musculoskeletal issue patient is obese.  No red flags noted however given his history of kidney disease as well as poorly treated hypertension I will obtain a CT scan to rule out other entities that could be contributing to this low back pain.  If that is negative, we will hopefully be able to discharge him home with pain control reassurance and outpatient follow-up    ____________________________________________   FINAL CLINICAL IMPRESSION(S) / ED DIAGNOSES  Final diagnoses:  None      This chart was dictated using voice recognition software.  Despite best efforts to proofread,  errors can occur which can change meaning.      Jeanmarie PlantMcShane, Laqueena Hinchey A, MD 12/13/16 [phone]

## 2017-01-02 ENCOUNTER — Emergency Department: Payer: Self-pay

## 2017-01-02 ENCOUNTER — Encounter: Payer: Self-pay | Admitting: Emergency Medicine

## 2017-01-02 ENCOUNTER — Emergency Department
Admission: EM | Admit: 2017-01-02 | Discharge: 2017-01-02 | Disposition: A | Discharge status: Home / Self Care | Payer: Self-pay | Attending: Emergency Medicine | Admitting: Emergency Medicine

## 2017-01-02 ENCOUNTER — Other Ambulatory Visit: Payer: Self-pay

## 2017-01-02 DIAGNOSIS — E119 Type 2 diabetes mellitus without complications: Secondary | ICD-10-CM | POA: Insufficient documentation

## 2017-01-02 DIAGNOSIS — R519 Headache, unspecified: Secondary | ICD-10-CM

## 2017-01-02 DIAGNOSIS — T465X5A Adverse effect of other antihypertensive drugs, initial encounter: Secondary | ICD-10-CM | POA: Insufficient documentation

## 2017-01-02 DIAGNOSIS — Z79899 Other long term (current) drug therapy: Secondary | ICD-10-CM | POA: Insufficient documentation

## 2017-01-02 DIAGNOSIS — I10 Essential (primary) hypertension: Secondary | ICD-10-CM | POA: Insufficient documentation

## 2017-01-02 DIAGNOSIS — Z791 Long term (current) use of non-steroidal anti-inflammatories (NSAID): Secondary | ICD-10-CM | POA: Insufficient documentation

## 2017-01-02 DIAGNOSIS — R51 Headache: Secondary | ICD-10-CM | POA: Insufficient documentation

## 2017-01-02 DIAGNOSIS — T887XXA Unspecified adverse effect of drug or medicament, initial encounter: Secondary | ICD-10-CM

## 2017-01-02 LAB — CBC
HCT: 46.9 % (ref 40.0–52.0)
Hemoglobin: 15.7 g/dL (ref 13.0–18.0)
MCH: 28.7 pg (ref 26.0–34.0)
MCHC: 33.5 g/dL (ref 32.0–36.0)
MCV: 85.6 fL (ref 80.0–100.0)
PLATELETS: 218 10*3/uL (ref 150–440)
RBC: 5.48 MIL/uL (ref 4.40–5.90)
RDW: 14.8 % — AB (ref 11.5–14.5)
WBC: 8.8 10*3/uL (ref 3.8–10.6)

## 2017-01-02 LAB — BASIC METABOLIC PANEL
Anion gap: 10 (ref 5–15)
BUN: 20 mg/dL (ref 6–20)
CALCIUM: 9 mg/dL (ref 8.9–10.3)
CO2: 22 mmol/L (ref 22–32)
CREATININE: 1.5 mg/dL — AB (ref 0.61–1.24)
Chloride: 106 mmol/L (ref 101–111)
GFR, EST AFRICAN AMERICAN: 59 mL/min — AB (ref >60)
GFR, EST NON AFRICAN AMERICAN: 51 mL/min — AB (ref >60)
GLUCOSE: 118 mg/dL — AB (ref 65–99)
Potassium: 4 mmol/L (ref 3.5–5.1)
Sodium: 138 mmol/L (ref 135–145)

## 2017-01-02 MED ORDER — CLONIDINE HCL 0.1 MG/24HR TD PTWK
0.1000 mg | MEDICATED_PATCH | TRANSDERMAL | Status: DC
  Administered 2017-01-02: 0.1 mg via TRANSDERMAL
  Filled 2017-01-02: qty 1

## 2017-01-02 MED ORDER — BUTALBITAL-APAP-CAFFEINE 50-325-40 MG PO TABS: 1.0000 | ORAL_TABLET | Freq: Four times a day (QID) | ORAL | 0 refills | Status: AC | PRN

## 2017-01-02 MED ORDER — DIPHENHYDRAMINE HCL 50 MG/ML IJ SOLN
25.0000 mg | Freq: Once | INTRAMUSCULAR | Status: AC
  Administered 2017-01-02: 25 mg via INTRAVENOUS
  Filled 2017-01-02: qty 1

## 2017-01-02 MED ORDER — KETOROLAC TROMETHAMINE 30 MG/ML IJ SOLN
30.0000 mg | Freq: Once | INTRAMUSCULAR | Status: AC
  Administered 2017-01-02: 30 mg via INTRAVENOUS
  Filled 2017-01-02: qty 1

## 2017-01-02 MED ORDER — SODIUM CHLORIDE 0.9 % IV BOLUS (SEPSIS)
500.0000 mL | Freq: Once | INTRAVENOUS | Status: AC
Start: 2017-01-02 — End: 2017-01-02
  Administered 2017-01-02: 500 mL via INTRAVENOUS

## 2017-01-02 MED ORDER — METOCLOPRAMIDE HCL 5 MG/ML IJ SOLN
10.0000 mg | Freq: Once | INTRAMUSCULAR | Status: AC
  Administered 2017-01-02: 10 mg via INTRAVENOUS
  Filled 2017-01-02: qty 2

## 2017-01-02 NOTE — ED Triage Notes (Signed)
Pt arrived to the ED for complaints of headache on the left side. Pt reports that he was seating at home and began to experience a headache. Pt states that the pain is concentrated on the left side and radiates to his neck; pain can be increased by touching the affected area . Pt reports that he suffers of high blood pressure and that yesterday he had a headache secondary to his BP being high, took his BP medication and the headache improved. Pt has no obvious neurological deficits during triage. Pt is AOx4 in no apparent distress, MD notified.

## 2017-01-02 NOTE — ED Provider Notes (Signed)
[facility] Emergency Department Provider Note   ____________________________________________   First MD Initiated Contact with Patient 01/02/17 [phone]     (approximate)  I have reviewed the triage vital signs and the nursing notes.   HISTORY  Chief Complaint Headache    HPI Francisco Rice is a 56 y.o. male who comes into the hospital today with some severe headache.  He states that started around 10 or 11:00.  He did not take anything for the headache.  He states that the other night his blood pressure was high and he took his medication and the headache got better.  He was started on hydralazine recently and his wife states that the headache started after he started the medication.  The patient states that he took his blood pressure medicines today but it did not seem to help.  He takes labetalol clonidine and hydralazine.  The patient states that the headache has subsided some and is currently a 6 out of 10 in intensity.  He points to the left temporal parietal area of his head where he has pain to palpation.  The patient denies any nausea or vomiting and denies chest pain, blurred vision, dizziness, numbness or weakness.  He is here today for evaluation.  Past Medical History:  Diagnosis Date  . Diabetes mellitus without complication (HCC)   . Gout   . Hypertension   . Sleep apnea     Patient Active Problem List   Diagnosis Date Noted  . Diabetes mellitus (HCC) 04/24/2016  . Sleep apnea 04/24/2016  . Essential hypertension 04/24/2016  . Hypertensive encephalopathy 01/13/2016    Past Surgical History:  Procedure Laterality Date  . APPENDECTOMY      Prior to Admission medications   Medication Sig Start Date End Date Taking? Authorizing Provider  cloNIDine (CATAPRES) 0.2 MG tablet Take 1 tablet by mouth 2 (two) times daily.    [provider]  labetalol (NORMODYNE) 100 MG tablet Take 1 tablet (100 mg total) by mouth 2 (two) times daily.  01/16/16   Altamese Dilling, MD  NAPROXEN PO Take by mouth.    [provider]    Allergies Amlodipine; Other; and Tradjenta [linagliptin]  Family History  Problem Relation Age of Onset  . Healthy Mother   . Hypertension Mother   . Parkinson's disease Father     Social History Social History   Tobacco Use  . Smoking status: Never Smoker  . Smokeless tobacco: Never Used  Substance Use Topics  . Alcohol use: No  . Drug use: No    Review of Systems  Constitutional: No fever/chills Eyes: No visual changes. ENT: No sore throat. Cardiovascular: Denies chest pain. Respiratory: Denies shortness of breath. Gastrointestinal: No abdominal pain.  No nausea, no vomiting.  No diarrhea.  No constipation. Genitourinary: Negative for dysuria. Musculoskeletal: Negative for back pain. Skin: Negative for rash. Neurological: Headache   ____________________________________________   PHYSICAL EXAM:  VITAL SIGNS: ED Triage Vitals  Enc Vitals Group     BP 01/02/17 0109 (!) 181/101     Pulse Rate 01/02/17 0109 83     Resp 01/02/17 0109 16     Temp 01/02/17 0109 97.9 F (36.6 C)     Temp Source 01/02/17 0109 Oral     SpO2 01/02/17 0109 97 %     Weight 01/02/17 0110 282 lb (127.9 kg)     Height 01/02/17 0110 6' (1.829 m)     Head Circumference --  Peak Flow --      Pain Score 01/02/17 0109 6     Pain Loc --      Pain Edu? --      Excl. in GC? --     Constitutional: Alert and oriented. Well appearing and in moderate distress. Eyes: Conjunctivae are normal. PERRL. EOMI. Head: Atraumatic. Nose: No congestion/rhinnorhea. Mouth/Throat: Mucous membranes are moist.  Oropharynx non-erythematous. Cardiovascular: Normal rate, regular rhythm. Grossly normal heart sounds.  Good peripheral circulation. Respiratory: Normal respiratory effort.  No retractions. Lungs CTAB. Gastrointestinal: Soft and nontender. No distention. No abdominal bruits. No CVA  tenderness. Musculoskeletal: No lower extremity tenderness nor edema.   Neurologic:  Normal speech and language.  Cranial nerves II through XII are grossly intact with no focal motor neuro deficit Skin:  Skin is warm, dry and intact.  Psychiatric: Mood and affect are normal.   ____________________________________________   LABS (all labs ordered are listed, but only abnormal results are displayed)  Labs Reviewed  CBC - Abnormal; Notable for the following components:      Result Value   RDW 14.8 (*)    All other components within normal limits  BASIC METABOLIC PANEL - Abnormal; Notable for the following components:   Glucose, Bld 118 (*)    Creatinine, Ser 1.50 (*)    GFR calc non Af Amer 51 (*)    GFR calc Af Amer 59 (*)    All other components within normal limits   ____________________________________________  EKG  none ____________________________________________  RADIOLOGY  Ct Head Wo Contrast  Result Date: 01/02/2017 CLINICAL DATA:  Acute onset of left-sided headache, radiating to the neck. High blood pressure. EXAM: CT HEAD WITHOUT CONTRAST TECHNIQUE: Contiguous axial images were obtained from the base of the skull through the vertex without intravenous contrast. COMPARISON:  CT of the head and MRI of the brain performed 01/13/2016 FINDINGS: Brain: No evidence of acute infarction, hemorrhage, hydrocephalus, extra-axial collection or mass lesion/mass effect. Cavum septum pellucidum is noted. The posterior fossa, including the cerebellum, brainstem and fourth ventricle, is within normal limits. The third and lateral ventricles, and basal ganglia are unremarkable in appearance. The cerebral hemispheres are symmetric in appearance, with normal gray-white differentiation. No mass effect or midline shift is seen. Vascular: No hyperdense vessel or unexpected calcification. Skull: There is no evidence of fracture; visualized osseous structures are unremarkable in appearance.  Sinuses/Orbits: The orbits are within normal limits. The paranasal sinuses and mastoid air cells are well-aerated. Other: No significant soft tissue abnormalities are seen. IMPRESSION: Unremarkable noncontrast CT of the head. Electronically Signed   By: Roanna RaiderJeffery  Chang M.D.   On: 01/02/2017 05:43    ____________________________________________   PROCEDURES  Procedure(s) performed: None  Procedures  Critical Care performed: No  ____________________________________________   INITIAL IMPRESSION / ASSESSMENT AND PLAN / ED COURSE  As part of my medical decision making, I reviewed the following data within the electronic MEDICAL RECORD NUMBER Notes from prior ED visits and [place] Controlled Substance Database   This is a 56 year old male who comes into the hospital today with headache.  Patient has some elevated blood pressure.  My differential diagnosis includes hypertensive urgency, intracranial hemorrhage, hypertensive headache.  I did check some blood work and I did a CT scan of the patient's head.  The patient does not have the worst headache of his life but he reports that it has been there and is been coming and going for the past few days.  The patient's blood work  is unremarkable and a CT scan is negative.  I did give the patient a dose of Toradol, Benadryl and Reglan.  The patient's wife states that this headache did start after he started his most recent medication hydralazine.  After looking up the side effect profile of hydralazine headache is a common side effect of hydralazine.  It is possible that this medication could be causing the patient's headaches.  He did receive some fluid while in the emergency department and I will have him temporarily stop his hydralazine and then follow-up with his doctor.  I did put a clonidine patch on the patient's chest and his blood pressure did improve from the 180s to the 140 systolic.  The patient will be discharged home to follow-up with his primary  care physician.  He has no further complaints or concerns at this time.      ____________________________________________   FINAL CLINICAL IMPRESSION(S) / ED DIAGNOSES  Final diagnoses:  Acute nonintractable headache, unspecified headache type  Hypertension, unspecified type  Medication side effect     ED Discharge Orders    None       Note:  This document was prepared using Dragon voice recognition software and may include unintentional dictation errors.    Rebecka ApleyWebster, Marceline Napierala P, MD 01/02/17 [phone]

## 2017-01-02 NOTE — ED Notes (Signed)
Dr. Manson PasseyBrown consulted on the Pt's complaints.

## 2017-01-02 NOTE — ED Notes (Signed)
Pt discharged home after verbalizing understanding of discharge instructions; nad noted. 

## 2017-01-02 NOTE — Discharge Instructions (Signed)
Please follow up with your primary care physician.

## 2018-06-22 IMAGING — CT CT HEAD W/O CM
3 series · 15 of 47 positions shown, 18 images · non-contrast
Comparison: CT of the head and MRI of the brain performed
01/13/2016

CLINICAL DATA: Acute onset of left-sided headache, radiating to the
neck. High blood pressure.

EXAM:
CT HEAD WITHOUT CONTRAST
TECHNIQUE: Contiguous axial images were obtained from the base of the skull
through the vertex without intravenous contrast.

[Series 2: head wo · axial · 0.46mm/px · z∈[-126,-1]mm · 9 of 31 slices shown, 12 images]
[im 3/31  brain]
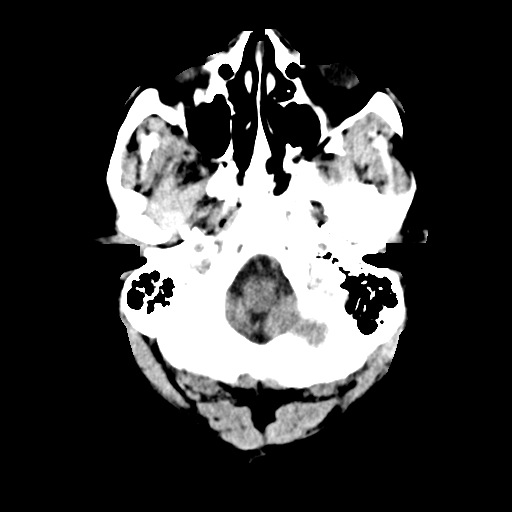
[im 3/31  bone]
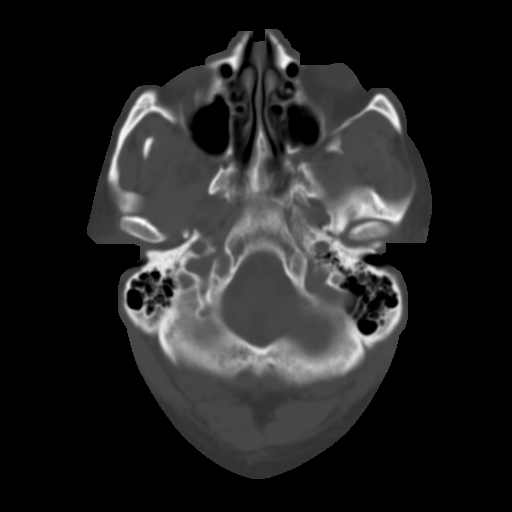
[im 6/31  brain]
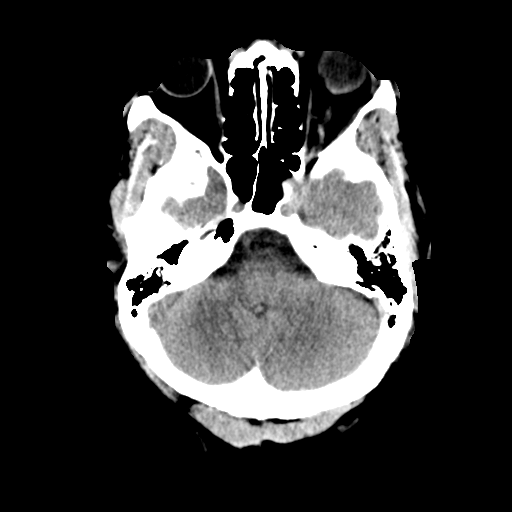
[im 9/31  brain]
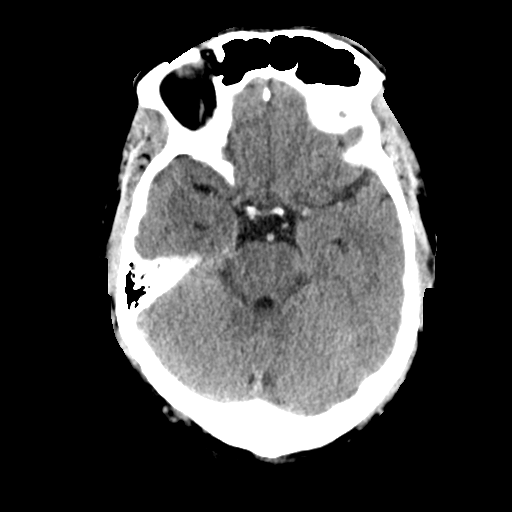
[im 12/31  brain]
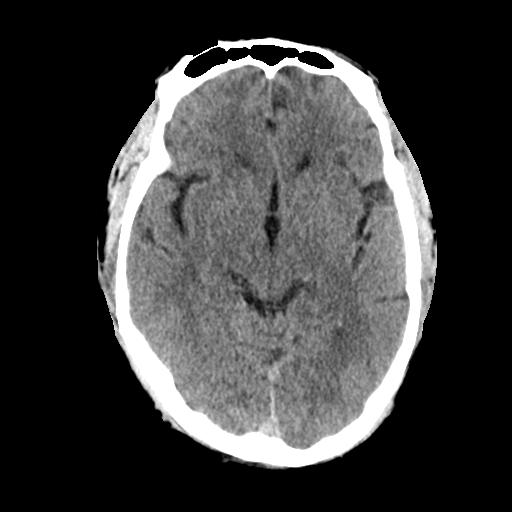
[im 16/31  brain]
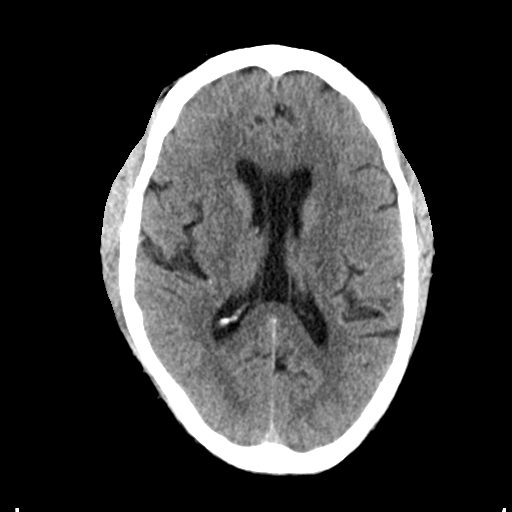
[im 16/31  bone]
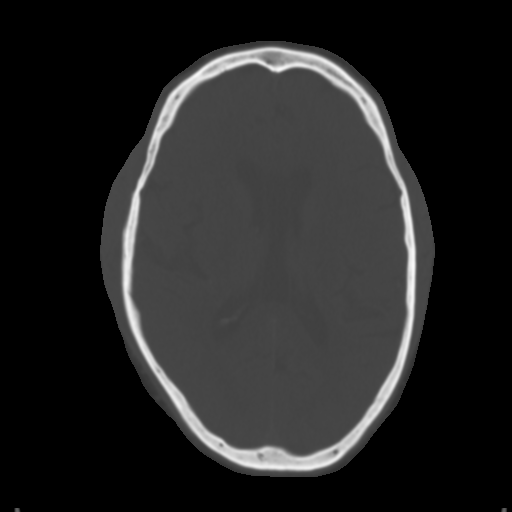
[im 19/31  brain]
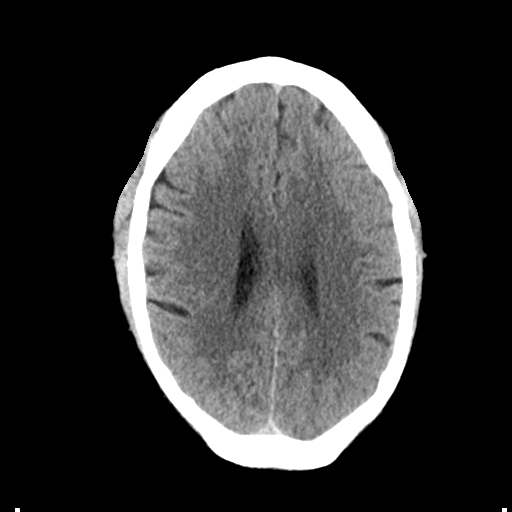
[im 22/31  brain]
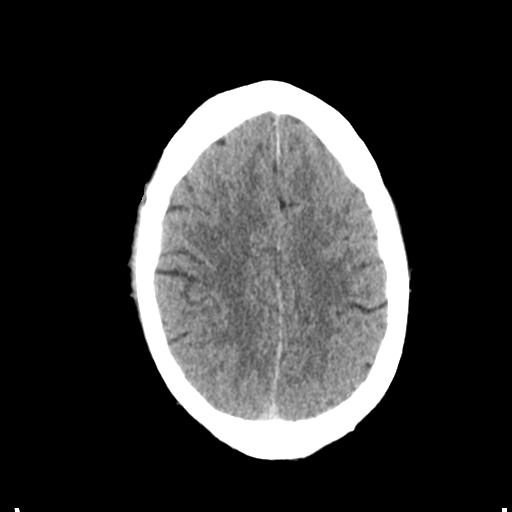
[im 25/31  brain]
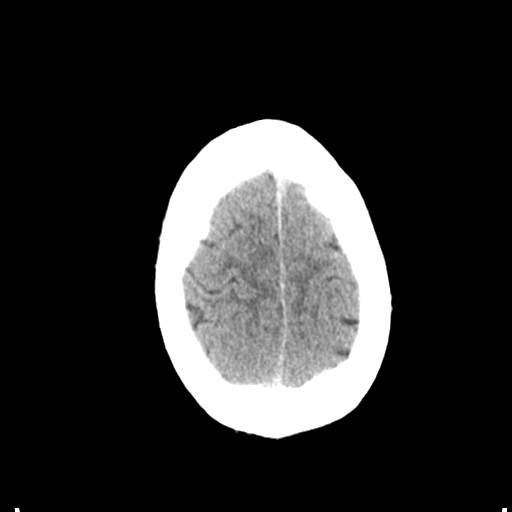
[im 28/31  brain]
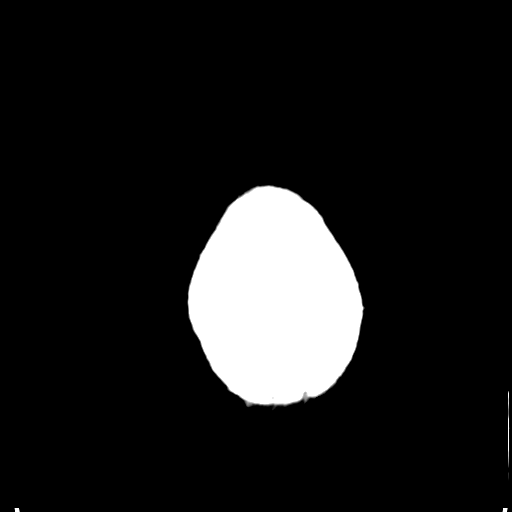
[im 28/31  bone]
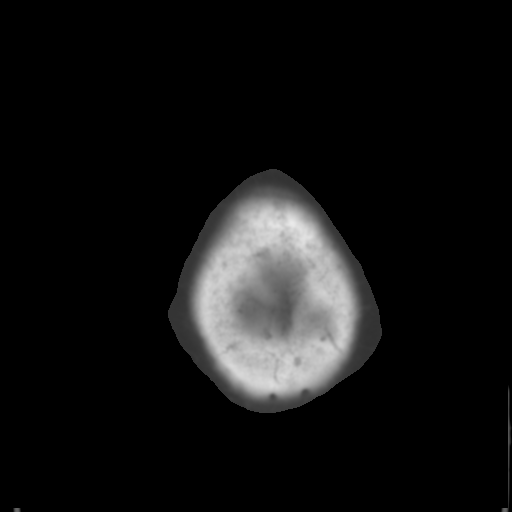

[Series 4: coronal soft tissue · coronal · 0.31mm/px · 3 of 75 slices shown]
[im 25/75  brain]
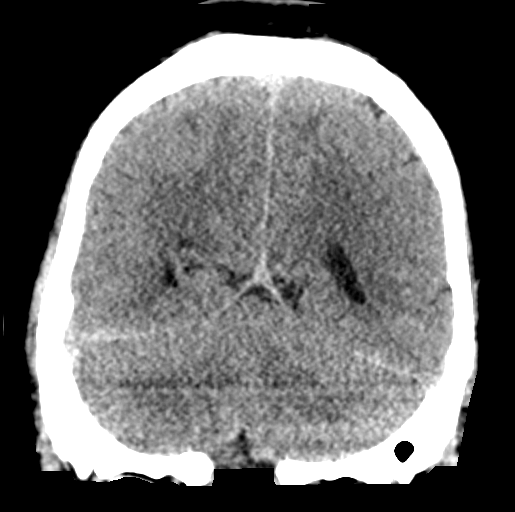
[im 33/75  brain]
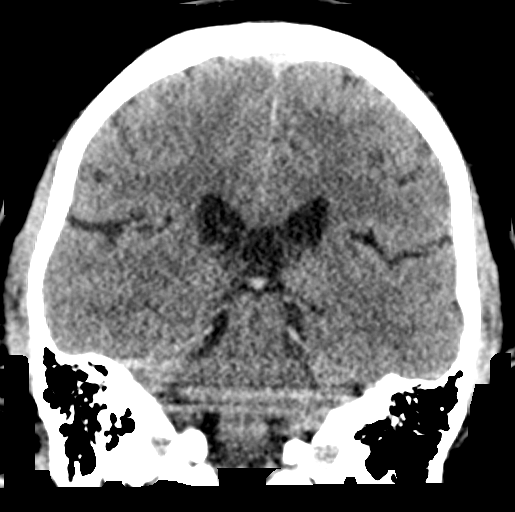
[im 42/75  brain]
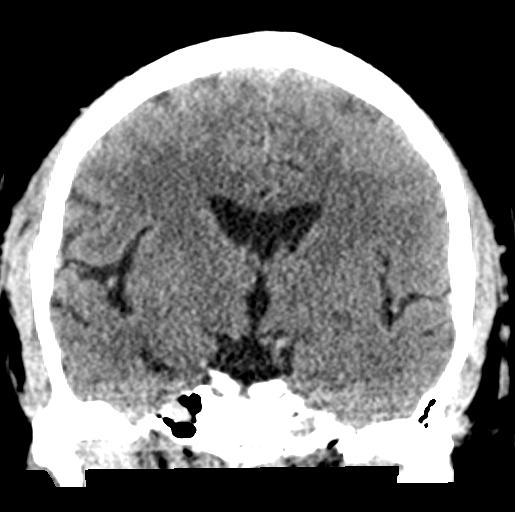

[Series 5: sagittal soft tissue · sagittal · 0.31mm/px · 3 of 54 slices shown]
[im 18/54  brain]
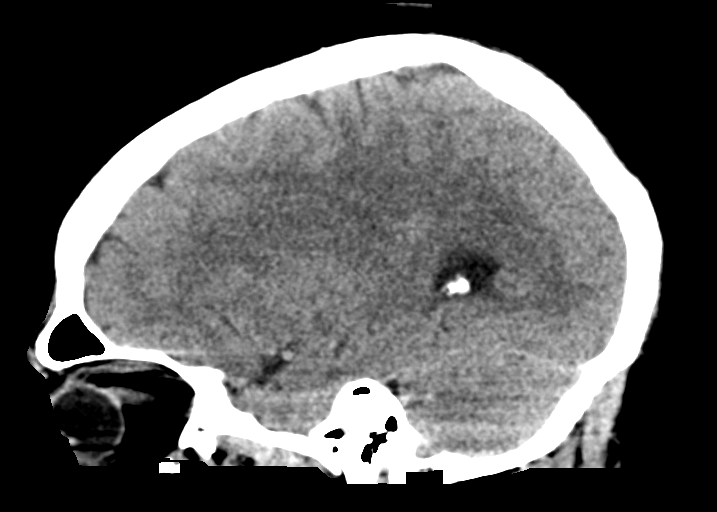
[im 27/54  brain]
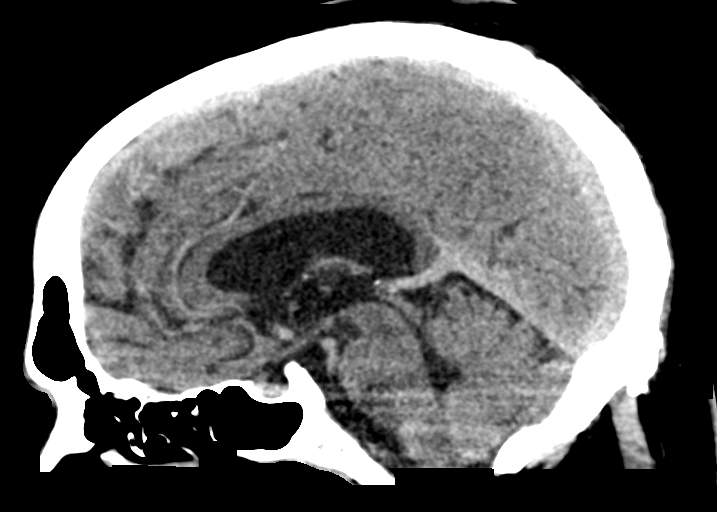
[im 36/54  brain]
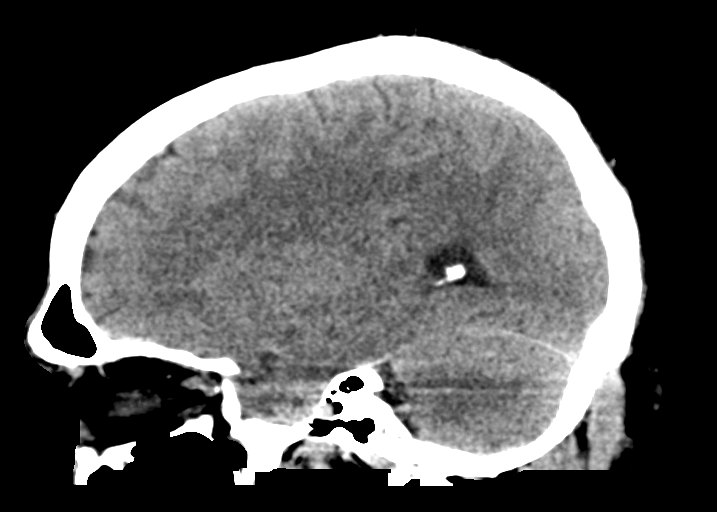

[15 of 47 positions shown; findings below may reference images not displayed]

FINDINGS: Brain: No evidence of acute infarction, hemorrhage, hydrocephalus,
extra-axial collection or mass lesion/mass effect.

Cavum septum pellucidum is noted.

The posterior fossa, including the cerebellum, brainstem and fourth
ventricle, is within normal limits. The third and lateral
ventricles, and basal ganglia are unremarkable in appearance. The
cerebral hemispheres are symmetric in appearance, with normal
gray-white differentiation. No mass effect or midline shift is seen.

Vascular: No hyperdense vessel or unexpected calcification.

Skull: There is no evidence of fracture; visualized osseous
structures are unremarkable in appearance.

Sinuses/Orbits: The orbits are within normal limits. The paranasal
sinuses and mastoid air cells are well-aerated.

Other: No significant soft tissue abnormalities are seen.
IMPRESSION: Unremarkable noncontrast CT of the head.

## 2021-06-23 ENCOUNTER — Emergency Department
Admission: EM | Admit: 2021-06-23 | Discharge: 2021-06-24 | Disposition: A | Discharge status: Home / Self Care | Payer: BC Managed Care – PPO | Attending: Emergency Medicine | Admitting: Emergency Medicine

## 2021-06-23 ENCOUNTER — Encounter: Payer: Self-pay | Admitting: Emergency Medicine

## 2021-06-23 DIAGNOSIS — S30861A Insect bite (nonvenomous) of abdominal wall, initial encounter: Secondary | ICD-10-CM | POA: Insufficient documentation

## 2021-06-23 DIAGNOSIS — I10 Essential (primary) hypertension: Secondary | ICD-10-CM | POA: Insufficient documentation

## 2021-06-23 DIAGNOSIS — W57XXXA Bitten or stung by nonvenomous insect and other nonvenomous arthropods, initial encounter: Secondary | ICD-10-CM | POA: Insufficient documentation

## 2021-06-23 MED ORDER — DOXYCYCLINE HYCLATE 100 MG PO TABS
100.0000 mg | ORAL_TABLET | Freq: Once | ORAL | Status: AC
  Administered 2021-06-24: 100 mg via ORAL
  Filled 2021-06-23: qty 1

## 2021-06-23 NOTE — ED Triage Notes (Signed)
Pt presents to ER from home, reports has a spot to right flank area which he thinks is a tick bite. Pt reports was trying to remove it but was not able to. Pt denies any other complaint at present

## 2021-06-23 NOTE — ED Notes (Signed)
Pt reports HTN and did not take his medication today

## 2021-06-24 MED ORDER — DOXYCYCLINE HYCLATE 100 MG PO CAPS: 100.0000 mg | ORAL_CAPSULE | Freq: Two times a day (BID) | ORAL | 0 refills | Status: AC

## 2021-06-24 NOTE — Discharge Instructions (Addendum)
As we discussed, we cannot see any tick or tick parts within the wound at this time.  It most likely just needs time to heal.  We gave you a prescription for antibiotics that should help should you have been exposed to a tickborne illness.  Please take the full course of treatment (2 times a day for the next week).  Follow-up with your regular doctor as needed.  However we also recommend you follow-up with your doctor to discuss your blood pressure.  It was elevated tonight, and you will benefit from following up with your primary care doctor to determine if you need to change your blood pressure medication regimen.    Return to the emergency department if you develop new or worsening symptoms that concern you.

## 2021-06-24 NOTE — ED Provider Notes (Signed)
[facility] Provider Note    Event Date/Time   First MD Initiated Contact with Patient 06/23/21 2324     (approximate)   History   Tick Removal   HPI  Francisco Rice is a 61 y.o. male who presents for possible tick bite or embedded tick along the waistband of the right lateral side of his abdomen.  He said that he thinks there might have been a tick there for couple of days.  He tried to pull it off himself and thought that he got it but then the area continued to be irritated, itching and burning a little bit and being red and raised although it is difficult for him to tell.   He denies fever, neck pain or stiffness, nausea, vomiting, chest pain, shortness of breath, cough.     Physical Exam   Triage Vital Signs: ED Triage Vitals  Enc Vitals Group     BP 06/23/21 2255 (!) 181/99     Pulse Rate 06/23/21 2255 81     Resp 06/23/21 2255 18     Temp 06/23/21 2255 98.7 F (37.1 C)     Temp Source 06/23/21 2255 Oral     SpO2 06/23/21 2255 98 %     Weight 06/23/21 2257 104.3 kg (230 lb)     Height 06/23/21 2257 1.829 m (6')     Head Circumference --      Peak Flow --      Pain Score 06/23/21 2257 0     Pain Loc --      Pain Edu? --      Excl. in GC? --     Most recent vital signs: Vitals:   06/23/21 2257 06/24/21 0044  BP: (!) 181/99 (!) 166/87  Pulse: 89 88  Resp: 18 20  Temp: 98.7 F (37.1 C)   SpO2: 100% 98%     General: Awake, no distress.  CV:  Good peripheral perfusion.  Resp:  Normal effort.  Abd:  No distention.  Other:  Patient has an area that looks consistent with a recent insect bite or embedded insect such as a tick along the right lateral waistband towards the posterior side.  There is a surrounding area of induration that is less than a centimeter in diameter but also with some surrounding erythema.  Even with magnification I do not appreciate any tick or tick parts, although the patient has been excoriating the area with  tweezers and cause some minor tissue damage.  There is no evidence of abscess.   ED Results / Procedures / Treatments    PROCEDURES:  Critical Care performed: No  Procedures   MEDICATIONS ORDERED IN ED: Medications  doxycycline (VIBRA-TABS) tablet 100 mg (100 mg Oral Given 06/24/21 0042)     IMPRESSION / MDM / ASSESSMENT AND PLAN / ED COURSE  I reviewed the triage vital signs and the nursing notes.                              Differential diagnosis includes, but is not limited to, tick bite, abscess, cellulitis.  Patient's presentation is most consistent with acute, uncomplicated illness.  Vital signs are normal other than hypertension, which is likely due to his chronic essential hypertension and the acute issue that brought him to the emergency department.  I considered further evaluation or even admission for the hypertension, which initially was in the 180/100 range, but his blood  pressure came down after being here and it is unrelated to his primary complaint.  There is no indication for further evaluation and he reports being compliant with his medications.  On exam, it appears that he likely had a tick which he removed and even with magnification I do not appreciate any additional insect parts.  Since it is unknown whether or not the tick was in place for several days, I will treat him empirically with doxycycline, but again there is no indication for additional lab work at this time.  Patient understands and agrees with plan and I gave my usual and customary follow-up recommendations and return precautions.       FINAL CLINICAL IMPRESSION(S) / ED DIAGNOSES   Final diagnoses:  Tick bite of abdomen, initial encounter  Hypertension, unspecified type     Rx / DC Orders   ED Discharge Orders          Ordered    doxycycline (VIBRAMYCIN) 100 MG capsule  2 times daily        06/24/21 0006             Note:  This document was prepared using Dragon voice  recognition software and may include unintentional dictation errors.   Loleta Rose, MD 06/24/21 [phone]

## 2021-07-19 ENCOUNTER — Other Ambulatory Visit
Admission: RE | Admit: 2021-07-19 | Discharge: 2021-07-19 | Disposition: A | Discharge status: Home / Self Care | Payer: BC Managed Care – PPO | Attending: Internal Medicine | Admitting: Internal Medicine

## 2021-07-19 DIAGNOSIS — E119 Type 2 diabetes mellitus without complications: Secondary | ICD-10-CM | POA: Diagnosis present

## 2021-07-19 DIAGNOSIS — N189 Chronic kidney disease, unspecified: Secondary | ICD-10-CM | POA: Insufficient documentation

## 2021-07-19 DIAGNOSIS — E1122 Type 2 diabetes mellitus with diabetic chronic kidney disease: Secondary | ICD-10-CM | POA: Diagnosis not present

## 2021-07-19 LAB — COMPREHENSIVE METABOLIC PANEL
ALT: 39 U/L (ref 0–44)
AST: 35 U/L (ref 15–41)
Albumin: 4 g/dL (ref 3.5–5.0)
Alkaline Phosphatase: 56 U/L (ref 38–126)
Anion gap: 6 (ref 5–15)
BUN: 13 mg/dL (ref 6–20)
CO2: 25 mmol/L (ref 22–32)
Calcium: 8.9 mg/dL (ref 8.9–10.3)
Chloride: 108 mmol/L (ref 98–111)
Creatinine, Ser: 1.21 mg/dL (ref 0.61–1.24)
GFR, Estimated: >60 mL/min (ref >60)
Glucose, Bld: 95 mg/dL (ref 70–99)
Potassium: 3.9 mmol/L (ref 3.5–5.1)
Sodium: 139 mmol/L (ref 135–145)
Total Bilirubin: 1.2 mg/dL (ref 0.3–1.2)
Total Protein: 6.9 g/dL (ref 6.5–8.1)

## 2021-07-19 LAB — LIPID PANEL
Cholesterol: 182 mg/dL (ref 0–200)
HDL: 54 mg/dL (ref >40)
LDL Cholesterol: 112 mg/dL — ABNORMAL HIGH (ref 0–99)
Total CHOL/HDL Ratio: 3.4 RATIO
Triglycerides: 80 mg/dL (ref <150)
VLDL: 16 mg/dL (ref 0–40)

## 2021-07-19 LAB — HEMOGLOBIN A1C
Hgb A1c MFr Bld: 4.9 % (ref 4.8–5.6)
Mean Plasma Glucose: 93.93 mg/dL

## 2022-05-17 ENCOUNTER — Encounter: Payer: Self-pay | Admitting: Internal Medicine

## 2022-05-17 ENCOUNTER — Ambulatory Visit: Payer: BC Managed Care – PPO | Admitting: Internal Medicine

## 2022-05-17 VITALS — BP 174/126 | HR 56 | Ht 73.0 in | Wt 238.8 lb

## 2022-05-17 DIAGNOSIS — E119 Type 2 diabetes mellitus without complications: Secondary | ICD-10-CM | POA: Diagnosis not present

## 2022-05-17 DIAGNOSIS — I10 Essential (primary) hypertension: Secondary | ICD-10-CM | POA: Diagnosis not present

## 2022-05-17 DIAGNOSIS — E782 Mixed hyperlipidemia: Secondary | ICD-10-CM

## 2022-05-17 MED ORDER — HYDROCHLOROTHIAZIDE 12.5 MG PO TABS: 12.5000 mg | ORAL_TABLET | Freq: Every day | ORAL | 0 refills | Status: DC

## 2022-05-17 MED ORDER — LABETALOL HCL 300 MG PO TABS: 300.0000 mg | ORAL_TABLET | Freq: Three times a day (TID) | ORAL | 0 refills | Status: DC

## 2022-05-17 MED ORDER — OLMESARTAN MEDOXOMIL 40 MG PO TABS: 40.0000 mg | ORAL_TABLET | Freq: Every day | ORAL | 0 refills | Status: AC

## 2022-05-17 NOTE — Progress Notes (Signed)
Established Patient Office Visit  Subjective:  Patient ID: Francisco Rice, male    DOB: December 24, 1960  Age: 62 y.o. MRN: [id number]  Chief Complaint  Patient presents with   Follow-up    Follow up    No new complaints, here for lab review and medication refills. BP very high as he'Eivan Gallina been noncompliant with his meds recently. Home BG readings have been satisfactory.     No other concerns at this time.   Past Medical History:  Diagnosis Date   Diabetes mellitus without complication (HCC)    Gout    Hypertension    Sleep apnea     Past Surgical History:  Procedure Laterality Date   APPENDECTOMY      Social History   Socioeconomic History   Marital status: Married    Spouse name: Not on file   Number of children: Not on file   Years of education: Not on file   Highest education level: Not on file  Occupational History   Not on file  Tobacco Use   Smoking status: Never   Smokeless tobacco: Never  Substance and Sexual Activity   Alcohol use: No   Drug use: No   Sexual activity: Not on file  Other Topics Concern   Not on file  Social History Narrative   Not on file   Social Determinants of Health   Financial Resource Strain: Not on file  Food Insecurity: Not on file  Transportation Needs: Not on file  Physical Activity: Not on file  Stress: Not on file  Social Connections: Not on file  Intimate Partner Violence: Not on file    Family History  Problem Relation Age of Onset   Healthy Mother    Hypertension Mother    Parkinson'Kieth Hartis disease Father     Allergies  Allergen Reactions   Amlodipine Swelling   Other     "blood pressure medication caused swelling in ankles" - pt unsure of name   Tradjenta [Linagliptin] Swelling    Review of Systems  Constitutional: Negative.   HENT: Negative.    Eyes: Negative.   Respiratory: Negative.    Cardiovascular: Negative.   Gastrointestinal: Negative.   Genitourinary: Negative.   Skin: Negative.    Neurological: Negative.   Endo/Heme/Allergies: Negative.        Objective:   BP (!) 174/126   Pulse (!) 56   Ht 6\' 1"  (1.854 m)   Wt 238 lb 12.8 oz (108.3 kg)   SpO2 97%   BMI 31.51 kg/m   Vitals:   05/17/22 1102  BP: (!) 174/126  Pulse: (!) 56  Height: 6\' 1"  (1.854 m)  Weight: 238 lb 12.8 oz (108.3 kg)  SpO2: 97%  BMI (Calculated): 31.51    Physical Exam Vitals reviewed.  Constitutional:      Appearance: Normal appearance.  HENT:     Head: Normocephalic.     Left Ear: There is no impacted cerumen.     Nose: Nose normal.     Mouth/Throat:     Mouth: Mucous membranes are moist.     Pharynx: No posterior oropharyngeal erythema.  Eyes:     Extraocular Movements: Extraocular movements intact.     Pupils: Pupils are equal, round, and reactive to light.  Cardiovascular:     Rate and Rhythm: Regular rhythm.     Chest Wall: PMI is not displaced.     Pulses: Normal pulses.     Heart sounds: Normal heart sounds. No murmur heard. Pulmonary:  Effort: Pulmonary effort is normal.     Breath sounds: Normal air entry. No rhonchi or rales.  Abdominal:     General: Abdomen is flat. Bowel sounds are normal. There is no distension.     Palpations: Abdomen is soft. There is no hepatomegaly, splenomegaly or mass.     Tenderness: There is no abdominal tenderness.  Musculoskeletal:        General: Normal range of motion.     Cervical back: Normal range of motion and neck supple.     Right lower leg: No edema.     Left lower leg: No edema.  Skin:    General: Skin is warm and dry.  Neurological:     General: No focal deficit present.     Mental Status: He is alert and oriented to person, place, and time.     Cranial Nerves: No cranial nerve deficit.     Motor: No weakness.  Psychiatric:        Mood and Affect: Mood normal.        Behavior: Behavior normal.      No results found for any visits on 05/17/22.  No results found for this or any previous visit (from the  past 2160 hour(Oluwaseun Bruyere)).    Assessment & Plan:   Problem List Items Addressed This Visit       Cardiovascular and Mediastinum   Essential hypertension - Primary   Relevant Medications   olmesartan (BENICAR) 40 MG tablet   hydrochlorothiazide (HYDRODIURIL) 12.5 MG tablet   labetalol (NORMODYNE) 300 MG tablet     Endocrine   Diabetes mellitus (HCC)   Relevant Medications   olmesartan (BENICAR) 40 MG tablet   Other Relevant Orders   Hemoglobin A1c   Other Visit Diagnoses     Mixed hyperlipidemia       Relevant Medications   olmesartan (BENICAR) 40 MG tablet   hydrochlorothiazide (HYDRODIURIL) 12.5 MG tablet   labetalol (NORMODYNE) 300 MG tablet   Other Relevant Orders   Comprehensive metabolic panel   Lipid panel       Return in about 3 weeks (around 06/07/2022) for lab results.   Total time spent: 30 minutes  Luna Fuse, MD  05/17/2022

## 2022-06-07 ENCOUNTER — Ambulatory Visit: Payer: BC Managed Care – PPO | Admitting: Internal Medicine

## 2022-06-07 ENCOUNTER — Encounter: Payer: Self-pay | Admitting: Internal Medicine

## 2022-06-07 VITALS — BP 126/80 | HR 62 | Ht 73.0 in | Wt 238.0 lb

## 2022-06-07 DIAGNOSIS — N400 Enlarged prostate without lower urinary tract symptoms: Secondary | ICD-10-CM | POA: Diagnosis not present

## 2022-06-07 DIAGNOSIS — E782 Mixed hyperlipidemia: Secondary | ICD-10-CM | POA: Insufficient documentation

## 2022-06-07 DIAGNOSIS — I10 Essential (primary) hypertension: Secondary | ICD-10-CM

## 2022-06-07 DIAGNOSIS — E119 Type 2 diabetes mellitus without complications: Secondary | ICD-10-CM

## 2022-06-07 NOTE — Progress Notes (Signed)
Established Patient Office Visit  Subjective:  Patient ID: Francisco Rice, male    DOB: [birth year]  Age: 61 y.o. MRN: [id number]  Chief Complaint  Patient presents with   Follow-up    3 weeks follow up    BP now back under control.   No other concerns at this time.   Past Medical History:  Diagnosis Date   Diabetes mellitus without complication (HCC)    Gout    Hypertension    Sleep apnea     Past Surgical History:  Procedure Laterality Date   APPENDECTOMY      Social History   Socioeconomic History   Marital status: Married    Spouse name: Not on file   Number of children: Not on file   Years of education: Not on file   Highest education level: Not on file  Occupational History   Not on file  Tobacco Use   Smoking status: Never   Smokeless tobacco: Never  Substance and Sexual Activity   Alcohol use: No   Drug use: No   Sexual activity: Not on file  Other Topics Concern   Not on file  Social History Narrative   Not on file   Social Determinants of Health   Financial Resource Strain: Not on file  Food Insecurity: Not on file  Transportation Needs: Not on file  Physical Activity: Not on file  Stress: Not on file  Social Connections: Not on file  Intimate Partner Violence: Not on file    Family History  Problem Relation Age of Onset   Healthy Mother    Hypertension Mother    Parkinson'[place] Kappes disease Father     Allergies  Allergen Reactions   Amlodipine Swelling   Other     "blood pressure medication caused swelling in ankles" - pt unsure of name   Tradjenta [Linagliptin] Swelling    Review of Systems  Constitutional: Negative.   HENT: Negative.    Eyes: Negative.   Respiratory: Negative.    Cardiovascular: Negative.   Gastrointestinal: Negative.   Genitourinary: Negative.   Skin: Negative.   Neurological: Negative.   Endo/Heme/Allergies: Negative.        Objective:   BP 126/80   Pulse 62   Ht 6\' 1"  (1.854 m)   Wt 238 lb (108  kg)   SpO2 97%   BMI 31.40 kg/m   Vitals:   06/07/22 1455  BP: 126/80  Pulse: 62  Height: 6\' 1"  (1.854 m)  Weight: 238 lb (108 kg)  SpO2: 97%  BMI (Calculated): 31.41    Physical Exam Vitals reviewed.  Constitutional:      Appearance: Normal appearance.  HENT:     Head: Normocephalic.     Left Ear: There is no impacted cerumen.     Nose: Nose normal.     Mouth/Throat:     Mouth: Mucous membranes are moist.     Pharynx: No posterior oropharyngeal erythema.  Eyes:     Extraocular Movements: Extraocular movements intact.     Pupils: Pupils are equal, round, and reactive to light.  Cardiovascular:     Rate and Rhythm: Regular rhythm.     Chest Wall: PMI is not displaced.     Pulses: Normal pulses.     Heart sounds: Normal heart sounds. No murmur heard. Pulmonary:     Effort: Pulmonary effort is normal.     Breath sounds: Normal air entry. No rhonchi or rales.  Abdominal:     General: Abdomen  is flat. Bowel sounds are normal. There is no distension.     Palpations: Abdomen is soft. There is no hepatomegaly, splenomegaly or mass.     Tenderness: There is no abdominal tenderness.  Musculoskeletal:        General: Normal range of motion.     Cervical back: Normal range of motion and neck supple.     Right lower leg: No edema.     Left lower leg: No edema.  Skin:    General: Skin is warm and dry.  Neurological:     General: No focal deficit present.     Mental Status: He is alert and oriented to person, place, and time.     Cranial Nerves: No cranial nerve deficit.     Motor: No weakness.  Psychiatric:        Mood and Affect: Mood normal.        Behavior: Behavior normal.     No results found for any visits on 06/07/22.  No results found for this or any previous visit (from the past 2160 hour(Demitris Pokorny)).    Assessment & Plan:   Problem List Items Addressed This Visit       Cardiovascular and Mediastinum   Essential hypertension - Primary   Relevant Orders   CBC  With Diff/Platelet     Endocrine   Diabetes mellitus (HCC)   Other Visit Diagnoses     Mixed hyperlipidemia       BPH without urinary obstruction       Relevant Orders   PSA       Return in about 2 months (around 08/07/2022) for cpe with labs prior.   Total time spent: 20 minutes  Luna Fuse, MD  06/07/2022   This document may have been prepared by [facility] Voice Recognition software and as such may include unintentional dictation errors.

## 2022-08-09 ENCOUNTER — Encounter: Payer: BC Managed Care – PPO | Admitting: Internal Medicine

## 2022-10-19 ENCOUNTER — Other Ambulatory Visit: Payer: Self-pay | Admitting: Internal Medicine

## 2022-10-19 DIAGNOSIS — I10 Essential (primary) hypertension: Secondary | ICD-10-CM

## 2022-10-19 DIAGNOSIS — E782 Mixed hyperlipidemia: Secondary | ICD-10-CM

## 2022-10-19 DIAGNOSIS — E119 Type 2 diabetes mellitus without complications: Secondary | ICD-10-CM

## 2022-11-20 ENCOUNTER — Emergency Department
Admission: EM | Admit: 2022-11-20 | Discharge: 2022-11-20 | Disposition: A | Discharge status: Home / Self Care | Payer: BC Managed Care – PPO | Attending: Emergency Medicine | Admitting: Emergency Medicine

## 2022-11-20 ENCOUNTER — Other Ambulatory Visit: Payer: Self-pay

## 2022-11-20 DIAGNOSIS — Z79899 Other long term (current) drug therapy: Secondary | ICD-10-CM | POA: Insufficient documentation

## 2022-11-20 DIAGNOSIS — I10 Essential (primary) hypertension: Secondary | ICD-10-CM | POA: Diagnosis present

## 2022-11-20 MED ORDER — CHLORTHALIDONE 25 MG PO TABS: 25.0000 mg | ORAL_TABLET | Freq: Every day | ORAL | 2 refills | Status: AC

## 2022-11-20 NOTE — ED Provider Notes (Signed)
   [facility] Provider Note    Event Date/Time   First MD Initiated Contact with Patient 11/20/22 1014     (approximate)   History   Hypertension   HPI  Francisco Rice is a 62 y.o. male with a history of high blood pressure who presents with elevated blood pressure.  Patient reports that he only takes labetalol.  Review of record demonstrates his PCP had prescribed him olmesartan, labetalol and hydrochlorothiazide but he reports he stopped taking the olmesartan and hydrochlorothiazide because of possible side effects.  He feels well and has no physical complaints     Physical Exam   Triage Vital Signs: ED Triage Vitals  Encounter Vitals Group     BP 11/20/22 1008 (!) 171/109     Systolic BP Percentile --      Diastolic BP Percentile --      Pulse Rate 11/20/22 1008 64     Resp 11/20/22 1008 18     Temp 11/20/22 1008 97.9 F (36.6 C)     Temp Source 11/20/22 1008 Oral     SpO2 11/20/22 1008 99 %     Weight 11/20/22 1006 111.1 kg (245 lb)     Height 11/20/22 1006 (!) 0.152 m (6")     Head Circumference --      Peak Flow --      Pain Score 11/20/22 1006 0     Pain Loc --      Pain Education --      Exclude from Growth Chart --     Most recent vital signs: Vitals:   11/20/22 1008 11/20/22 1019  BP: (!) 171/109 (!) 170/105  Pulse: 64   Resp: 18   Temp: 97.9 F (36.6 C)   SpO2: 99%      General: Awake, no distress.  CV:  Good peripheral perfusion.  Resp:  Normal effort.  Abd:  No distention.  Other:     ED Results / Procedures / Treatments   Labs (all labs ordered are listed, but only abnormal results are displayed) Labs Reviewed - No data to display   EKG     RADIOLOGY     PROCEDURES:  Critical Care performed:   Procedures   MEDICATIONS ORDERED IN ED: Medications - No data to display   IMPRESSION / MDM / ASSESSMENT AND PLAN / ED COURSE  I reviewed the triage vital signs and the nursing notes. Patient's  presentation is most consistent with exacerbation of chronic illness.  Patient with known hypertension presents with elevated blood pressure, he reports it was 180/109 at home, rechecked in room 168/87 at this time.  He did take his labetalol today  He is unwilling to take olmesartan or hydrochlorothiazide, does agree to try chlorthalidone, encouraged him to follow-up with his PCP for continued management, no indication for admission or further workup at this time        FINAL CLINICAL IMPRESSION(S) / ED DIAGNOSES   Final diagnoses:  Primary hypertension  Uncontrolled hypertension     Rx / DC Orders   ED Discharge Orders          Ordered    chlorthalidone (HYGROTON) 25 MG tablet  Daily        11/20/22 1035             Note:  This document was prepared using Dragon voice recognition software and may include unintentional dictation errors.   Jene Every, MD 11/20/22 1039

## 2022-11-20 NOTE — ED Notes (Signed)
See triage notes. Patient has hx of hypertension and is on medication for same. Patient stated he has been having HTN for the past week.

## 2022-11-20 NOTE — ED Triage Notes (Signed)
Pt presents to ED for high BP of 182/109 at home today.

## 2023-07-03 ENCOUNTER — Ambulatory Visit: Admitting: Internal Medicine

## 2023-10-10 ENCOUNTER — Other Ambulatory Visit: Payer: Self-pay

## 2023-10-10 ENCOUNTER — Emergency Department
Admission: EM | Admit: 2023-10-10 | Discharge: 2023-10-10 | Disposition: A | Discharge status: Home / Self Care | Payer: Self-pay | Attending: Emergency Medicine | Admitting: Emergency Medicine

## 2023-10-10 DIAGNOSIS — I10 Essential (primary) hypertension: Secondary | ICD-10-CM | POA: Insufficient documentation

## 2023-10-10 DIAGNOSIS — E119 Type 2 diabetes mellitus without complications: Secondary | ICD-10-CM | POA: Insufficient documentation

## 2023-10-10 DIAGNOSIS — M5416 Radiculopathy, lumbar region: Secondary | ICD-10-CM | POA: Insufficient documentation

## 2023-10-10 MED ORDER — LIDOCAINE 5 % EX PTCH: 1.0000 | MEDICATED_PATCH | Freq: Two times a day (BID) | CUTANEOUS | 0 refills | Status: AC

## 2023-10-10 MED ORDER — LIDOCAINE 5 % EX PTCH
1.0000 | MEDICATED_PATCH | CUTANEOUS | Status: DC
  Administered 2023-10-10: 1 via TRANSDERMAL
  Filled 2023-10-10: qty 1

## 2023-10-10 MED ORDER — ACETAMINOPHEN 500 MG PO TABS
1000.0000 mg | ORAL_TABLET | Freq: Once | ORAL | Status: AC
  Administered 2023-10-10: 1000 mg via ORAL
  Filled 2023-10-10: qty 2

## 2023-10-10 MED ORDER — CYCLOBENZAPRINE HCL 5 MG PO TABS: 5.0000 mg | ORAL_TABLET | Freq: Three times a day (TID) | ORAL | 0 refills | Status: AC | PRN

## 2023-10-10 NOTE — ED Triage Notes (Signed)
 Patient ambulatory to triage with complaints of pain shooting down the back of his left leg. States it started when he was on the toilet having a bowel movement. Patient states pain has decreased since bowel movement, does state he was straining to poop. Denies loss of bowel or bladder control. Patient states he looked on google and was concerned for herniated disc. Denies any injury/recent trauma.

## 2023-10-10 NOTE — ED Provider Notes (Signed)
 [facility] Provider Note    Event Date/Time   First MD Initiated Contact with Patient 10/10/23 [phone]     (approximate)   History   Chief Complaint Leg Pain   HPI  Francisco Rice is a 63 y.o. male with past medical history of hypertension, diabetes, and gout who presents to the ED complaining of leg pain.  Patient reports that he has had about 2 days of increasing pain shooting down the posterior portion of his left leg.  He denies any falls or injuries to his leg or back, does state pain was worse while he was sitting on the toilet this evening.  He denies any numbness or weakness in his legs, has not had any saddle anesthesia or incontinence.  He does report recent constipation.  He is not currently taking medication for his blood pressure due to side effects with previous medications, states he is instead taking a natural regimen.     Physical Exam   Triage Vital Signs: ED Triage Vitals [10/10/23 0156]  Encounter Vitals Group     BP (!) 211/110     Girls Systolic BP Percentile      Girls Diastolic BP Percentile      Boys Systolic BP Percentile      Boys Diastolic BP Percentile      Pulse Rate 68     Resp 18     Temp 97.8 F (36.6 C)     Temp Source Oral     SpO2 100 %     Weight 223 lb (101.2 kg)     Height 6' (1.829 m)     Head Circumference      Peak Flow      Pain Score 3     Pain Loc      Pain Education      Exclude from Growth Chart     Most recent vital signs: Vitals:   10/10/23 0156  BP: (!) 211/110  Pulse: 68  Resp: 18  Temp: 97.8 F (36.6 C)  SpO2: 100%    Constitutional: Alert and oriented. Eyes: Conjunctivae are normal. Head: Atraumatic. Nose: No congestion/rhinnorhea. Mouth/Throat: Mucous membranes are moist.  Cardiovascular: Normal rate, regular rhythm. Grossly normal heart sounds.  2+ radial and DP pulses bilaterally. Respiratory: Normal respiratory effort.  No retractions. Lungs CTAB. Gastrointestinal: Soft  and nontender. No distention. Musculoskeletal: No lower extremity tenderness nor edema.  Neurologic:  Normal speech and language. No gross focal neurologic deficits are appreciated.    ED Results / Procedures / Treatments   Labs (all labs ordered are listed, but only abnormal results are displayed) Labs Reviewed - No data to display  PROCEDURES:  Critical Care performed: No  Procedures   MEDICATIONS ORDERED IN ED: Medications  lidocaine (LIDODERM) 5 % 1 patch (1 patch Transdermal Patch Applied 10/10/23 0430)  acetaminophen  (TYLENOL ) tablet 1,000 mg (1,000 mg Oral Given 10/10/23 0430)     IMPRESSION / MDM / ASSESSMENT AND PLAN / ED COURSE  I reviewed the triage vital signs and the nursing notes.                              63 y.o. male with past medical history of hypertension, diabetes, and gout who presents to the ED complaining of pain in the left lower back shooting down his left leg for the past 2 days.  Patient's presentation is most consistent with acute, uncomplicated illness.  Differential diagnosis includes, but is not limited to, lumbar radiculopathy, cauda equina, lumbar strain.  Patient nontoxic-appearing and in no acute distress, vital signs are remarkable for hypertension but otherwise reassuring.  Symptoms consistent with lumbar radiculopathy, no features concerning for cauda equina.  No recent trauma to necessitate imaging of his leg or back.  Blood pressure noted to be elevated in triage, improved on recheck once patient placed in a room.  No symptoms related to this and he is not currently taking medication for his blood pressure.  No further workup needed for asymptomatic hypertension at this time, patient counseled to follow-up with his PCP for recheck.  Symptomatic treatment for lumbar radiculopathy was prescribed and he was counseled to return to the ED for new or worsening symptoms.  Patient agrees with plan.      FINAL CLINICAL IMPRESSION(S) / ED  DIAGNOSES   Final diagnoses:  Lumbar radiculopathy  Uncontrolled hypertension     Rx / DC Orders   ED Discharge Orders          Ordered    lidocaine (LIDODERM) 5 %  Every 12 hours        10/10/23 0428    cyclobenzaprine (FLEXERIL) 5 MG tablet  3 times daily PRN        10/10/23 0428             Note:  This document was prepared using Dragon voice recognition software and may include unintentional dictation errors.   Willo Dunnings, MD 10/10/23 [phone]
